# Patient Record
Sex: Male | Born: 1965 | Race: Black or African American | Hispanic: No | Marital: Single | Smoking: Current every day smoker
Health system: Southern US, Community
[De-identification: ages and names within clinical notes are randomized; demographics above are authoritative.]

## PROBLEM LIST (undated history)

## (undated) DIAGNOSIS — D170 Benign lipomatous neoplasm of skin and subcutaneous tissue of head, face and neck: Secondary | ICD-10-CM

## (undated) DIAGNOSIS — R21 Rash and other nonspecific skin eruption: Secondary | ICD-10-CM

## (undated) DIAGNOSIS — L989 Disorder of the skin and subcutaneous tissue, unspecified: Secondary | ICD-10-CM

## (undated) DIAGNOSIS — I10 Essential (primary) hypertension: Secondary | ICD-10-CM

## (undated) HISTORY — DX: Essential (primary) hypertension: I10

## (undated) HISTORY — PX: NO PAST SURGERIES: SHX2092

---

## 1998-03-30 ENCOUNTER — Encounter: Admission: RE | Admit: 1998-03-30 | Discharge: 1998-03-30 | Payer: Self-pay | Admitting: Family Medicine

## 2002-03-09 ENCOUNTER — Emergency Department (HOSPITAL_COMMUNITY): Admission: EM | Admit: 2002-03-09 | Discharge: 2002-03-09 | Payer: Self-pay | Admitting: Emergency Medicine

## 2013-10-21 ENCOUNTER — Emergency Department (HOSPITAL_COMMUNITY): Payer: Self-pay

## 2013-10-21 ENCOUNTER — Emergency Department (HOSPITAL_COMMUNITY)
Admission: EM | Admit: 2013-10-21 | Discharge: 2013-10-21 | Disposition: A | Discharge status: Home / Self Care | Payer: Self-pay | Attending: Emergency Medicine | Admitting: Emergency Medicine

## 2013-10-21 ENCOUNTER — Encounter (HOSPITAL_COMMUNITY): Payer: Self-pay | Admitting: Emergency Medicine

## 2013-10-21 DIAGNOSIS — K5289 Other specified noninfective gastroenteritis and colitis: Secondary | ICD-10-CM | POA: Insufficient documentation

## 2013-10-21 DIAGNOSIS — R05 Cough: Secondary | ICD-10-CM | POA: Insufficient documentation

## 2013-10-21 DIAGNOSIS — R1013 Epigastric pain: Secondary | ICD-10-CM

## 2013-10-21 DIAGNOSIS — F17200 Nicotine dependence, unspecified, uncomplicated: Secondary | ICD-10-CM | POA: Insufficient documentation

## 2013-10-21 DIAGNOSIS — R059 Cough, unspecified: Secondary | ICD-10-CM | POA: Insufficient documentation

## 2013-10-21 DIAGNOSIS — K529 Noninfective gastroenteritis and colitis, unspecified: Secondary | ICD-10-CM

## 2013-10-21 LAB — COMPREHENSIVE METABOLIC PANEL
ALK PHOS: 54 U/L (ref 39–117)
ALT: 9 U/L (ref 0–53)
AST: 26 U/L (ref 0–37)
Albumin: 3.6 g/dL (ref 3.5–5.2)
BUN: 15 mg/dL (ref 6–23)
CO2: 25 mEq/L (ref 19–32)
CREATININE: 1.15 mg/dL (ref 0.50–1.35)
Calcium: 9.1 mg/dL (ref 8.4–10.5)
Chloride: 97 mEq/L (ref 96–112)
GFR calc non Af Amer: 74 mL/min — ABNORMAL LOW (ref >90)
GFR, EST AFRICAN AMERICAN: 85 mL/min — AB (ref >90)
GLUCOSE: 108 mg/dL — AB (ref 70–99)
POTASSIUM: 3.9 meq/L (ref 3.7–5.3)
Sodium: 137 mEq/L (ref 137–147)
TOTAL PROTEIN: 6.9 g/dL (ref 6.0–8.3)
Total Bilirubin: 0.2 mg/dL — ABNORMAL LOW (ref 0.3–1.2)

## 2013-10-21 LAB — URINE MICROSCOPIC-ADD ON

## 2013-10-21 LAB — CBC WITH DIFFERENTIAL/PLATELET
BASOS ABS: 0 10*3/uL (ref 0.0–0.1)
Basophils Relative: 0 % (ref 0–1)
EOS PCT: 0 % (ref 0–5)
Eosinophils Absolute: 0 10*3/uL (ref 0.0–0.7)
HCT: 38.2 % — ABNORMAL LOW (ref 39.0–52.0)
Hemoglobin: 13.5 g/dL (ref 13.0–17.0)
LYMPHS PCT: 14 % (ref 12–46)
Lymphs Abs: 1.2 10*3/uL (ref 0.7–4.0)
MCH: 31.8 pg (ref 26.0–34.0)
MCHC: 35.3 g/dL (ref 30.0–36.0)
MCV: 90.1 fL (ref 78.0–100.0)
MONOS PCT: 11 % (ref 3–12)
Monocytes Absolute: 0.9 10*3/uL (ref 0.1–1.0)
Neutro Abs: 6.5 10*3/uL (ref 1.7–7.7)
Neutrophils Relative %: 75 % (ref 43–77)
PLATELETS: 220 10*3/uL (ref 150–400)
RBC: 4.24 MIL/uL (ref 4.22–5.81)
RDW: 13.4 % (ref 11.5–15.5)
WBC MORPHOLOGY: INCREASED
WBC: 8.6 10*3/uL (ref 4.0–10.5)

## 2013-10-21 LAB — URINALYSIS, ROUTINE W REFLEX MICROSCOPIC
Bilirubin Urine: NEGATIVE
Glucose, UA: NEGATIVE mg/dL
Hgb urine dipstick: NEGATIVE
Ketones, ur: NEGATIVE mg/dL
LEUKOCYTES UA: NEGATIVE
Nitrite: NEGATIVE
PROTEIN: 30 mg/dL — AB
Specific Gravity, Urine: 1.031 — ABNORMAL HIGH (ref 1.005–1.030)
UROBILINOGEN UA: 0.2 mg/dL (ref 0.0–1.0)
pH: 5.5 (ref 5.0–8.0)

## 2013-10-21 LAB — LIPASE, BLOOD: Lipase: 23 U/L (ref 11–59)

## 2013-10-21 MED ORDER — FENTANYL CITRATE 0.05 MG/ML IJ SOLN
50.0000 ug | Freq: Once | INTRAMUSCULAR | Status: AC
  Administered 2013-10-21: 50 ug via INTRAVENOUS
  Filled 2013-10-21: qty 2

## 2013-10-21 MED ORDER — IOHEXOL 300 MG/ML  SOLN
50.0000 mL | Freq: Once | INTRAMUSCULAR | Status: AC | PRN
  Administered 2013-10-21: 50 mL via ORAL

## 2013-10-21 MED ORDER — IOHEXOL 300 MG/ML  SOLN
100.0000 mL | Freq: Once | INTRAMUSCULAR | Status: AC | PRN
  Administered 2013-10-21: 100 mL via INTRAVENOUS

## 2013-10-21 MED ORDER — ACETAMINOPHEN 325 MG PO TABS
650.0000 mg | ORAL_TABLET | Freq: Once | ORAL | Status: AC
  Administered 2013-10-21: 650 mg via ORAL
  Filled 2013-10-21: qty 2

## 2013-10-21 MED ORDER — SODIUM CHLORIDE 0.9 % IV BOLUS (SEPSIS)
1000.0000 mL | Freq: Once | INTRAVENOUS | Status: AC
  Administered 2013-10-21: 1000 mL via INTRAVENOUS

## 2013-10-21 MED ORDER — ONDANSETRON HCL 4 MG/2ML IJ SOLN
4.0000 mg | Freq: Once | INTRAMUSCULAR | Status: AC
  Administered 2013-10-21: 4 mg via INTRAVENOUS
  Filled 2013-10-21: qty 2

## 2013-10-21 NOTE — ED Notes (Signed)
Pt to CT

## 2013-10-21 NOTE — ED Notes (Signed)
Pt reports centralized lower abdominal pain that began on Monday. Pt reports having one episode of emesis, which was today. Pt reports having a productive cough with clear sputum. Pt also states the abdominal pain is worse when he coughs. Pt reports having loose stools since yesterday. Pt has an oral temp on arrival of 100.6. Pt is A/O x4 and in NAD.

## 2013-10-21 NOTE — ED Notes (Signed)
Initial Contact - pt to [facility] with c/o 7/10 lower abd pain, also reports subjective fevers and cough.  Skin PWD, ambulatory with steady gait, speaking full/clear sentences, NAD.

## 2013-10-21 NOTE — ED Notes (Signed)
Pt aware of need for urine spec, sts unable to provide at this time.

## 2013-10-21 NOTE — ED Provider Notes (Signed)
CSN: [id number]     Arrival date & time 10/21/13  1829 History   First MD Initiated Contact with Patient 10/21/13 1902     Chief Complaint  Patient presents with  . Abdominal Pain  . Emesis     (Consider location/radiation/quality/duration/timing/severity/associated sxs/prior Treatment) HPI Comments: 48 yo old male with no medical hx, smoker, no surgeries presents with recurrent upper abdominal pain and productive cough, constant ache and fevers low grade for 5 days.  Mild vomiting.  No diarrhea but loose stools.  No GI hx.  Nothing improves.  Non radiating.  Patient is a 48 y.o. male presenting with abdominal pain and vomiting. The history is provided by the patient.  Abdominal Pain Associated symptoms: cough, nausea and vomiting   Associated symptoms: no chest pain, no chills, no diarrhea, no dysuria, no fever and no shortness of breath   Emesis Associated symptoms: abdominal pain   Associated symptoms: no chills, no diarrhea and no headaches     History reviewed. No pertinent past medical history. History reviewed. No pertinent past surgical history. No family history on file. History  Substance Use Topics  . Smoking status: Current Every Day Smoker -- 1.00 packs/day for 20 years    Types: Cigarettes  . Smokeless tobacco: Never Used  . Alcohol Use: Yes     Comment: Socially    Review of Systems  Constitutional: Negative for fever and chills.  HENT: Negative for congestion.   Eyes: Negative for visual disturbance.  Respiratory: Positive for cough. Negative for shortness of breath.   Cardiovascular: Negative for chest pain.  Gastrointestinal: Positive for nausea, vomiting and abdominal pain. Negative for diarrhea and blood in stool.  Genitourinary: Negative for dysuria and flank pain.  Musculoskeletal: Negative for back pain, neck pain and neck stiffness.  Skin: Negative for rash.  Neurological: Negative for light-headedness and headaches.      Allergies  Review of  patient's allergies indicates no known allergies.  Home Medications   Current Outpatient Rx  Name  Route  Sig  Dispense  Refill  . aspirin-sod bicarb-citric acid (ALKA-SELTZER) 325 MG TBEF tablet   Oral   Take 325 mg by mouth every 6 (six) hours as needed (flu-like symptoms).         Marland Kitchen DM-Doxylamine-Acetaminophen (NYQUIL COLD & FLU PO)   Oral   Take 1 capsule by mouth at bedtime as needed (FLU-LIKE SYMPTOMS).         Marland Kitchen ibuprofen (ADVIL,MOTRIN) 200 MG tablet   Oral   Take 400 mg by mouth every 6 (six) hours as needed (pain).          BP 141/85  Pulse 62  Temp(Src) 100.6 F (38.1 C) (Oral)  Resp 20  SpO2 96% Physical Exam  Nursing note and vitals reviewed. Constitutional: He is oriented to person, place, and time. He appears well-developed and well-nourished.  HENT:  Head: Normocephalic and atraumatic.  Mild dry mm  Eyes: Conjunctivae are normal. Right eye exhibits no discharge. Left eye exhibits no discharge.  Neck: Normal range of motion. Neck supple. No tracheal deviation present.  Cardiovascular: Normal rate and regular rhythm.   Pulmonary/Chest: Effort normal and breath sounds normal.  Abdominal: Soft. He exhibits no distension. There is tenderness (upper bilateral and epig, worse LUQ). There is no guarding.  Musculoskeletal: He exhibits no edema.  Neurological: He is alert and oriented to person, place, and time.  Skin: Skin is warm. No rash noted.  Psychiatric: He has a normal mood and  affect.    ED Course  Procedures (including critical care time) Labs Review Labs Reviewed  COMPREHENSIVE METABOLIC PANEL - Abnormal; Notable for the following:    Glucose, Bld 108 (*)    Total Bilirubin 0.2 (*)    GFR calc non Af Amer 74 (*)    GFR calc Af Amer 85 (*)    All other components within normal limits  CBC WITH DIFFERENTIAL - Abnormal; Notable for the following:    HCT 38.2 (*)    All other components within normal limits  URINALYSIS, ROUTINE W REFLEX  MICROSCOPIC - Abnormal; Notable for the following:    Specific Gravity, Urine 1.031 (*)    Protein, ur 30 (*)    All other components within normal limits  LIPASE, BLOOD  URINE MICROSCOPIC-ADD ON   Imaging Review Dg Chest 2 View  10/21/2013   CLINICAL DATA:  Abdominal pain and emesis  EXAM: CHEST  2 VIEW  COMPARISON:  None.  FINDINGS: Normal heart size and mediastinal contours. Mild pulmonary hyperinflation. No acute infiltrate or edema. No effusion or pneumothorax. No acute osseous findings.  IMPRESSION: No active cardiopulmonary disease.   Electronically Signed   By: Jorje Guild M.D.   On: 10/21/2013 20:52   Ct Abdomen Pelvis W Contrast  10/21/2013   CLINICAL DATA:  Centralized lower abdominal pain. Episode of emesis.  EXAM: CT ABDOMEN AND PELVIS WITH CONTRAST  TECHNIQUE: Multidetector CT imaging of the abdomen and pelvis was performed using the standard protocol following bolus administration of intravenous contrast.  CONTRAST:  44mL OMNIPAQUE IOHEXOL 300 MG/ML SOLN, 169mL OMNIPAQUE IOHEXOL 300 MG/ML SOLN  COMPARISON:  None.  FINDINGS: Lung bases are clear.  No evidence for free air.  Normal appearance of the liver, gallbladder and portal venous system. Normal appearance of the spleen and pancreas and adrenal glands. There is a 1 cm low-density structure in the left kidney upper pole which has fluid density on the coronal reformats and most compatible with a cyst. Normal appearance of the right kidney. No significant lymphadenopathy.  There is a small amount of free fluid in the pelvis and there is fluid along the right abdomen on sequence 2, image 47. The patient appears to have a small right inguinal hernia containing fluid. The fluid in this small inguinal hernia is slightly dense. The appendix extends into the small right inguinal hernia region but the appendix has a normal appearance and contains oral contrast. There does not appear to be appendix inflammation. No gross abnormality to the  small bowel loops. Incidentally, the patient has a circumaortic left renal vein. No acute bone abnormality.  IMPRESSION: There is a small amount of free fluid in the pelvis and right lower abdomen. The patient has a small right inguinal hernia which contains fluid and the appendix. However, the appendix is well opacified with contrast and there is no evidence for wall thickening or inflammation. The etiology of the free fluid is unknown but could be related to enteritis. Acute appendicitis is thought to be unlikely.  These results were called by telephone at the time of interpretation on 10/21/2013 at 10:46 PM to Dr. Elnora Morrison , who verbally acknowledged these results.   Electronically Signed   By: Markus Daft M.D.   On: 10/21/2013 22:47     EKG Interpretation None      MDM   Final diagnoses:  Epigastric pain  Enteritis   Clinically gastritis vs colitis.  Other differential GB, lower pneumonia, other. Fluids, pain meds,  labs, CT abd and CXR. Non toxic appearing.  CT no acute findings, mild RLQ free fluid but normal appendix. Discussed with radiology and surgery on call.  General surgery agrees that this is not appendicitis, fup outpt.   Pt improved on recheck, no RLQ pain. Results and differential diagnosis were discussed with the patient. Close follow up outpatient was discussed, patient comfortable with the plan.   Filed Vitals:   10/21/13 1850 10/21/13 2225  BP: 141/85 137/83  Pulse: 62 46  Temp: 100.6 F (38.1 C)   TempSrc: Oral   Resp: 20 18  SpO2: 96% 96%         Mariea Clonts, MD 10/21/13 2318

## 2013-10-21 NOTE — ED Notes (Signed)
Pt to radiology.

## 2013-10-21 NOTE — Discharge Instructions (Signed)
If you were given medicines take as directed.  If you are on coumadin or contraceptives realize their levels and effectiveness is altered by many different medicines.  If you have any reaction (rash, tongues swelling, other) to the medicines stop taking and see a physician.   Please follow up as directed and return to the ER or see a physician for new or worsening symptoms.  Thank you. Filed Vitals:   10/21/13 1850 10/21/13 2225  BP: 141/85 137/83  Pulse: 62 46  Temp: 100.6 F (38.1 C)   TempSrc: Oral   Resp: 20 18  SpO2: 96% 96%   If your abdominal pain worsens, you develop fevers, persistent vomiting or if your pain moves to the right lower quadrant return immediately to see your physician or come to the Emergency Department.  Thank you

## 2013-10-23 ENCOUNTER — Emergency Department (HOSPITAL_COMMUNITY): Payer: Self-pay

## 2013-10-23 ENCOUNTER — Encounter (HOSPITAL_COMMUNITY): Payer: Self-pay | Admitting: Emergency Medicine

## 2013-10-23 ENCOUNTER — Emergency Department (HOSPITAL_COMMUNITY)
Admission: EM | Admit: 2013-10-23 | Discharge: 2013-10-23 | Disposition: A | Discharge status: Home / Self Care | Payer: Self-pay | Attending: Emergency Medicine | Admitting: Emergency Medicine

## 2013-10-23 DIAGNOSIS — R05 Cough: Secondary | ICD-10-CM | POA: Insufficient documentation

## 2013-10-23 DIAGNOSIS — R079 Chest pain, unspecified: Secondary | ICD-10-CM

## 2013-10-23 DIAGNOSIS — F1210 Cannabis abuse, uncomplicated: Secondary | ICD-10-CM | POA: Insufficient documentation

## 2013-10-23 DIAGNOSIS — R0789 Other chest pain: Secondary | ICD-10-CM | POA: Insufficient documentation

## 2013-10-23 DIAGNOSIS — F1290 Cannabis use, unspecified, uncomplicated: Secondary | ICD-10-CM

## 2013-10-23 DIAGNOSIS — F17200 Nicotine dependence, unspecified, uncomplicated: Secondary | ICD-10-CM

## 2013-10-23 DIAGNOSIS — R110 Nausea: Secondary | ICD-10-CM

## 2013-10-23 DIAGNOSIS — R197 Diarrhea, unspecified: Secondary | ICD-10-CM

## 2013-10-23 DIAGNOSIS — R059 Cough, unspecified: Secondary | ICD-10-CM

## 2013-10-23 MED ORDER — ONDANSETRON 4 MG PO TBDP
4.0000 mg | ORAL_TABLET | Freq: Once | ORAL | Status: AC
  Administered 2013-10-23: 4 mg via ORAL
  Filled 2013-10-23: qty 1

## 2013-10-23 MED ORDER — PROMETHAZINE HCL 25 MG PO TABS: 25.0000 mg | ORAL_TABLET | Freq: Four times a day (QID) | ORAL | Status: DC | PRN

## 2013-10-23 MED ORDER — GI COCKTAIL ~~LOC~~
30.0000 mL | Freq: Once | ORAL | Status: AC
  Administered 2013-10-23: 30 mL via ORAL
  Filled 2013-10-23: qty 30

## 2013-10-23 MED ORDER — IPRATROPIUM-ALBUTEROL 0.5-2.5 (3) MG/3ML IN SOLN
3.0000 mL | Freq: Once | RESPIRATORY_TRACT | Status: AC
  Administered 2013-10-23: 3 mL via RESPIRATORY_TRACT
  Filled 2013-10-23: qty 3

## 2013-10-23 NOTE — ED Provider Notes (Signed)
CSN: [id number]     Arrival date & time 10/23/13  1208 History   First MD Initiated Contact with Patient 10/23/13 1226     Chief Complaint  Patient presents with  . Chest Pain  . Cough     (Consider location/radiation/quality/duration/timing/severity/associated sxs/prior Treatment) HPI Pt is a 48yo male c/o chest pain and cough. Pt states he was seen on 3/19 for GI and respiratory symptoms. States his vomiting has stopped but he still feels nauseous and had 2-3 episodes of watery diarrhea earlier today. Denies blood or mucous in stool. Pt states chest pain is in center of chest, intermittent, sharp in nature, 6/10. Pt reports taking tylenol at home w/o relief. Pt states he also feels generalized weakness but states he has not been eating or drinking much due to nausea. Denies urinary symptoms. Reports mild abdominal cramping. Reports subjective fever. No hx of abdominal surgeries. No sick contacts or recent travel. No hx of CAD. No hx of blood clots.  Pt does admit to smoking 1ppd tobacco and marijuana about once a day. Denies hx of IV drug use.   History reviewed. No pertinent past medical history. No past surgical history on file. No family history on file. History  Substance Use Topics  . Smoking status: Current Every Day Smoker -- 1.00 packs/day for 20 years    Types: Cigarettes  . Smokeless tobacco: Never Used  . Alcohol Use: Yes     Comment: Socially    Review of Systems  Constitutional: Positive for fever ( subjective). Negative for chills.  HENT: Positive for congestion.   Respiratory: Positive for cough and shortness of breath. Negative for wheezing and stridor.   Cardiovascular: Positive for chest pain. Negative for palpitations and leg swelling.  Gastrointestinal: Positive for nausea and diarrhea. Negative for vomiting, abdominal pain, constipation and blood in stool.  Genitourinary: Negative for dysuria, frequency, hematuria and flank pain.  All other systems reviewed and  are negative.      Allergies  Review of patient's allergies indicates no known allergies.  Home Medications   Current Outpatient Rx  Name  Route  Sig  Dispense  Refill  . promethazine (PHENERGAN) 25 MG tablet   Oral   Take 1 tablet (25 mg total) by mouth every 6 (six) hours as needed for nausea or vomiting.   12 tablet   0    BP 145/82  Pulse 94  Temp(Src) 98.7 F (37.1 C) (Oral)  Resp 16  SpO2 95% Physical Exam  Nursing note and vitals reviewed. Constitutional: He appears well-developed and well-nourished.  Pt lying comfortably in exam bed, NAD.   HENT:  Head: Normocephalic and atraumatic.  Eyes: Conjunctivae are normal. No scleral icterus.  Neck: Normal range of motion. Neck supple.  Cardiovascular: Normal rate, regular rhythm and normal heart sounds.   Pulmonary/Chest: Effort normal. No respiratory distress. He has decreased breath sounds in the right lower field and the left lower field. He has no wheezes. He has no rales. He exhibits no tenderness.  bibasilar decreased breath sounds. No respiratory distress. Able to speak in full sentences w/o difficulty.  Abdominal: Soft. Bowel sounds are normal. He exhibits no distension and no mass. There is tenderness. There is no rebound and no guarding.  Musculoskeletal: Normal range of motion.  Neurological: He is alert.  Skin: Skin is warm and dry.    ED Course  Procedures (including critical care time) Labs Review Labs Reviewed - No data to display Imaging Review Dg Chest 2  View  10/21/2013   CLINICAL DATA:  Abdominal pain and emesis  EXAM: CHEST  2 VIEW  COMPARISON:  None.  FINDINGS: Normal heart size and mediastinal contours. Mild pulmonary hyperinflation. No acute infiltrate or edema. No effusion or pneumothorax. No acute osseous findings.  IMPRESSION: No active cardiopulmonary disease.   Electronically Signed   By: Jorje Guild M.D.   On: 10/21/2013 20:52   Ct Abdomen Pelvis W Contrast  10/21/2013   CLINICAL  DATA:  Centralized lower abdominal pain. Episode of emesis.  EXAM: CT ABDOMEN AND PELVIS WITH CONTRAST  TECHNIQUE: Multidetector CT imaging of the abdomen and pelvis was performed using the standard protocol following bolus administration of intravenous contrast.  CONTRAST:  39mL OMNIPAQUE IOHEXOL 300 MG/ML SOLN, 112mL OMNIPAQUE IOHEXOL 300 MG/ML SOLN  COMPARISON:  None.  FINDINGS: Lung bases are clear.  No evidence for free air.  Normal appearance of the liver, gallbladder and portal venous system. Normal appearance of the spleen and pancreas and adrenal glands. There is a 1 cm low-density structure in the left kidney upper pole which has fluid density on the coronal reformats and most compatible with a cyst. Normal appearance of the right kidney. No significant lymphadenopathy.  There is a small amount of free fluid in the pelvis and there is fluid along the right abdomen on sequence 2, image 47. The patient appears to have a small right inguinal hernia containing fluid. The fluid in this small inguinal hernia is slightly dense. The appendix extends into the small right inguinal hernia region but the appendix has a normal appearance and contains oral contrast. There does not appear to be appendix inflammation. No gross abnormality to the small bowel loops. Incidentally, the patient has a circumaortic left renal vein. No acute bone abnormality.  IMPRESSION: There is a small amount of free fluid in the pelvis and right lower abdomen. The patient has a small right inguinal hernia which contains fluid and the appendix. However, the appendix is well opacified with contrast and there is no evidence for wall thickening or inflammation. The etiology of the free fluid is unknown but could be related to enteritis. Acute appendicitis is thought to be unlikely.  These results were called by telephone at the time of interpretation on 10/21/2013 at 10:46 PM to Dr. Elnora Morrison , who verbally acknowledged these results.    Electronically Signed   By: Markus Daft M.D.   On: 10/21/2013 22:47    CXR-    EKG Interpretation   Date/Time:  Saturday October 23 2013 12:13:21 EDT Ventricular Rate:  68 PR Interval:  123 QRS Duration: 82 QT Interval:  410 QTC Calculation: 436 R Axis:   79 Text Interpretation:  Sinus rhythm Biatrial enlargement ST elev, probable  normal early repol pattern No previous tracing Confirmed by Maryan Rued  MD,  Loree Fee ([id number]) on 10/23/2013 12:24:25 PM      MDM   Final diagnoses:  Cough  Chest pain  Nausea  Diarrhea  Current smoker  Marijuana smoker    Pt recently in ED for n/v/d and cough presenting to ED with nausea, diarrhea, cough and chest pain. Pt states he was not discharged on 3/19 with nausea medication and states chest pain is new. Pt is a daily smoker. No hx of asthma.  PERC negative. Not concerned for ACS.  Pt denies IVDU. EKG: unremarkable, doubt pericarditis.   CXR: normal.  Will tx symptomatically.  Tx in ED: GI cocktail, duoneb, and zofran. Pt states he did have  improvement with medications.  Rx: phenergan. Had 5 minute discussion with pt about importance of smoking cessation. Info packet provided. Advised to f/u with PCP. Return precautions provided. Pt verbalized understanding and agreement with tx plan.     Noland Fordyce, PA-C 10/23/13 1610

## 2013-10-23 NOTE — ED Notes (Signed)
He states he was recently seen here for GI and resp. Complaints.  Here today with c/opersistent cough and discomfort "around my heart".   EKG performed at triage.  He is in no distress and his skin is normal, warm and dry and he is breathing normally.

## 2013-10-24 NOTE — ED Provider Notes (Signed)
Medical screening examination/treatment/procedure(s) were performed by non-physician practitioner and as supervising physician I was immediately available for consultation/collaboration.   EKG Interpretation   Date/Time:  Saturday October 23 2013 12:13:21 EDT Ventricular Rate:  68 PR Interval:  123 QRS Duration: 82 QT Interval:  410 QTC Calculation: 436 R Axis:   79 Text Interpretation:  Sinus rhythm Biatrial enlargement ST elev, probable  normal early repol pattern No previous tracing Confirmed by Maryan Rued  MD,  Welton Bord ([id number]) on 10/23/2013 12:24:25 PM        Blanchie Dessert, MD 10/24/13 2132

## 2013-11-08 ENCOUNTER — Ambulatory Visit: Payer: Self-pay

## 2014-11-09 IMAGING — CR DG CHEST 2V
2 series · 2 of 2 positions shown · non-contrast
Comparison: 10/21/2013

CLINICAL DATA: Cough, congestion, fever

EXAM:
CHEST  2 VIEW

[w chest pa]
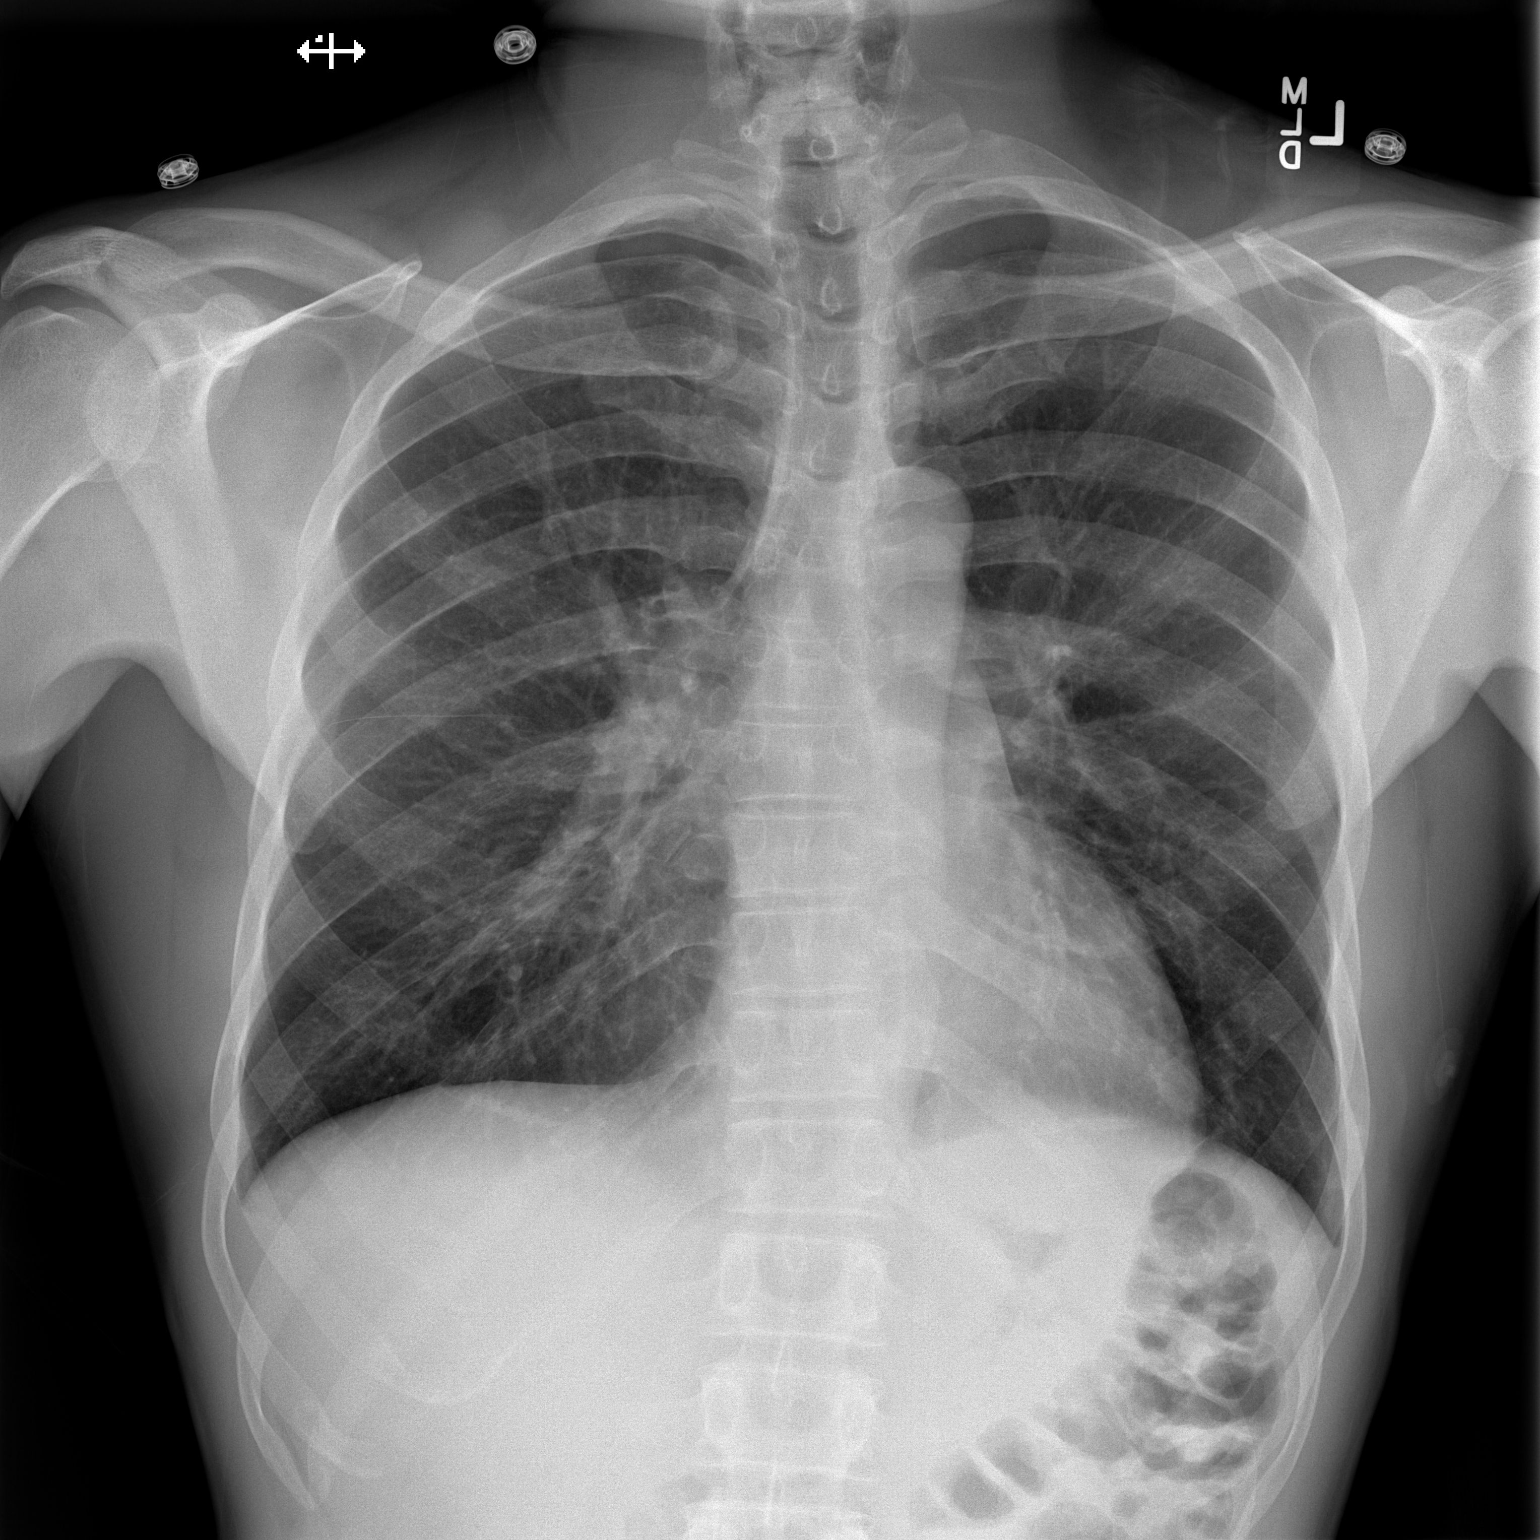

[w chest lat]
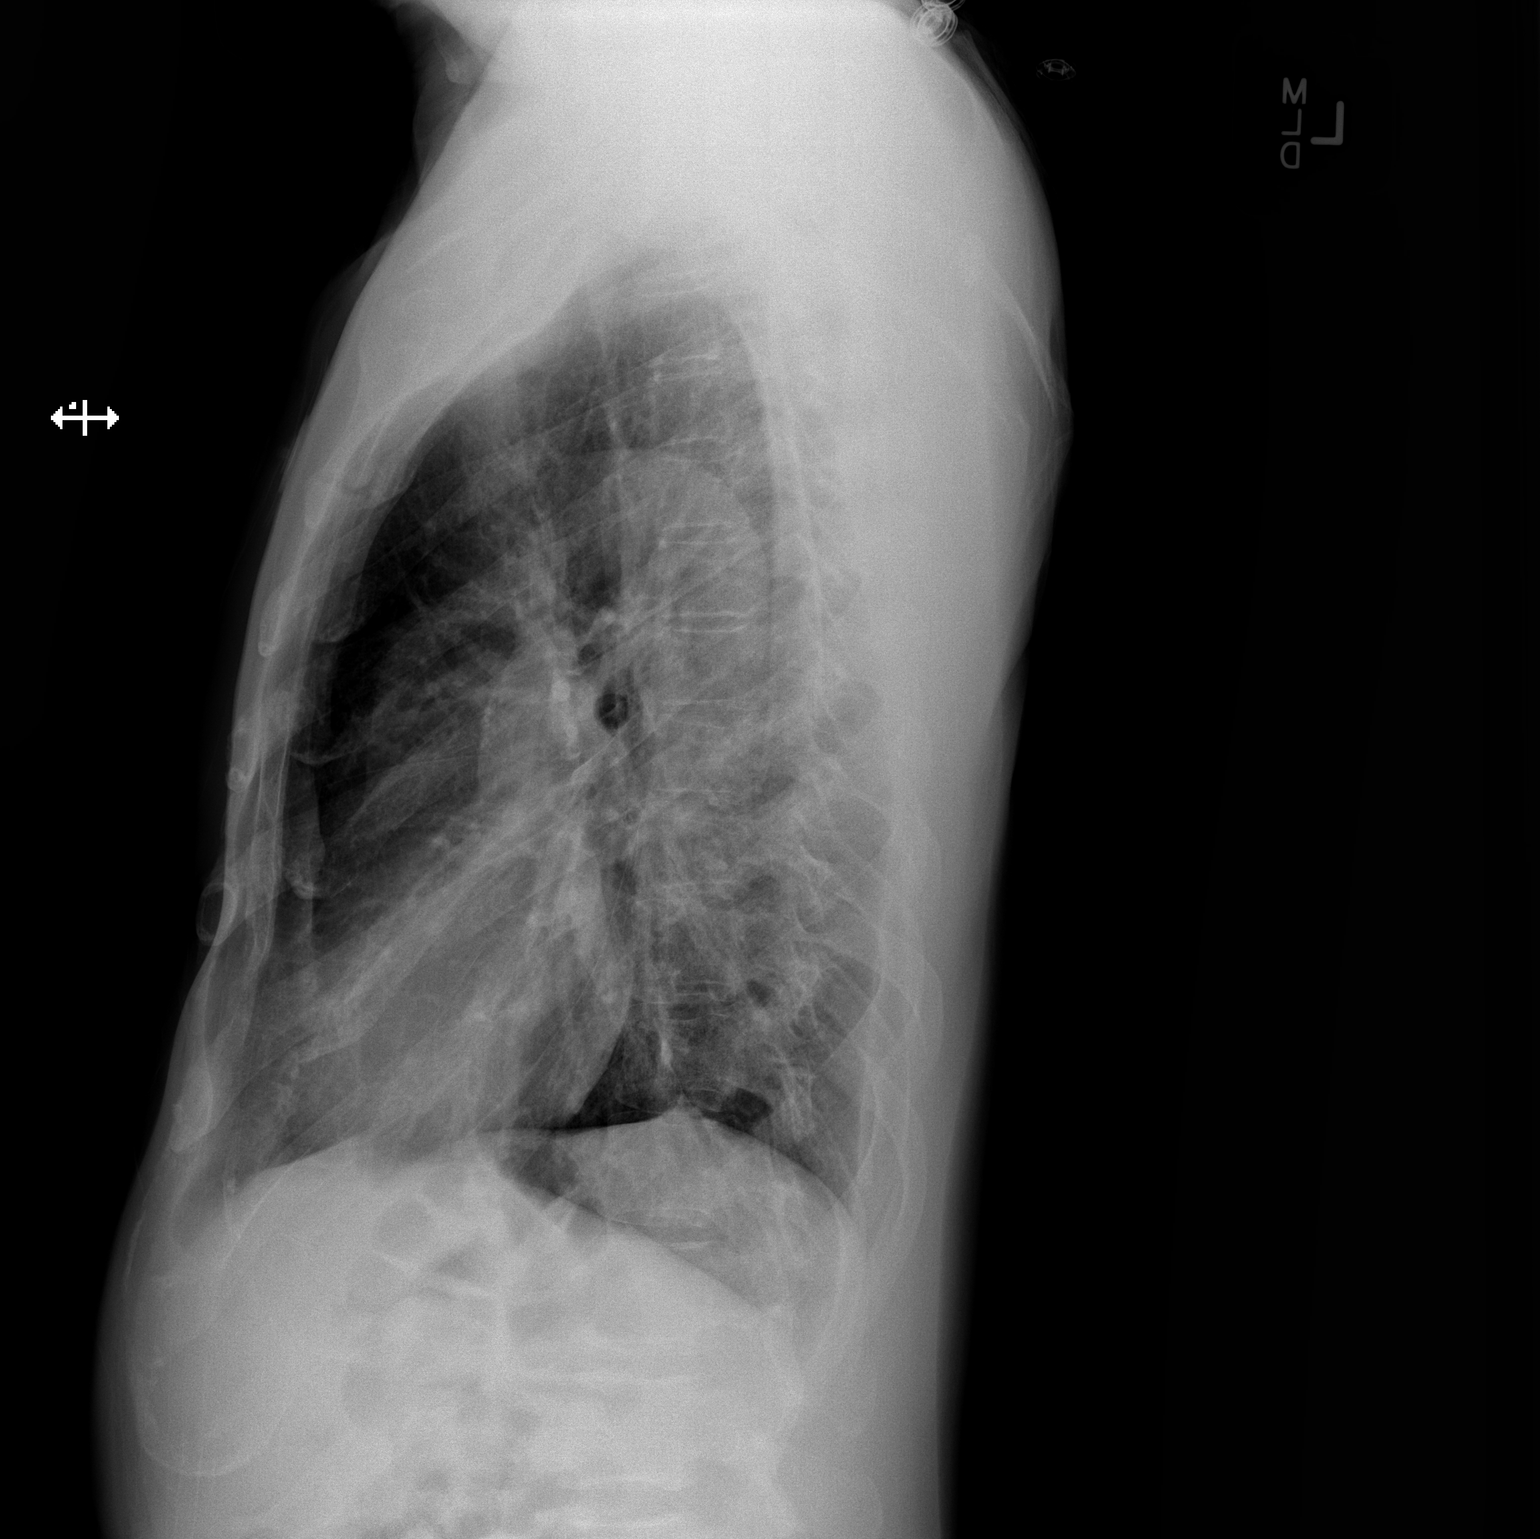

[2 of 2 positions shown; findings below may reference images not displayed]

FINDINGS: Lungs are clear. No pleural effusion or pneumothorax.

The heart is normal in size.

Visualized osseous structures are within normal limits.
IMPRESSION: No evidence of acute cardiopulmonary disease.

## 2015-09-19 ENCOUNTER — Ambulatory Visit: Payer: BLUE CROSS/BLUE SHIELD | Admitting: Internal Medicine

## 2015-09-27 ENCOUNTER — Encounter: Payer: Self-pay | Admitting: Internal Medicine

## 2015-09-27 ENCOUNTER — Ambulatory Visit (INDEPENDENT_AMBULATORY_CARE_PROVIDER_SITE_OTHER): Payer: BLUE CROSS/BLUE SHIELD | Admitting: Internal Medicine

## 2015-09-27 VITALS — BP 140/82 | HR 51 | Temp 98.1°F | Ht 69.0 in | Wt 155.9 lb

## 2015-09-27 DIAGNOSIS — D369 Benign neoplasm, unspecified site: Secondary | ICD-10-CM

## 2015-09-27 DIAGNOSIS — R7309 Other abnormal glucose: Secondary | ICD-10-CM

## 2015-09-27 DIAGNOSIS — Z113 Encounter for screening for infections with a predominantly sexual mode of transmission: Secondary | ICD-10-CM | POA: Diagnosis not present

## 2015-09-27 DIAGNOSIS — Z720 Tobacco use: Secondary | ICD-10-CM

## 2015-09-27 DIAGNOSIS — IMO0001 Reserved for inherently not codable concepts without codable children: Secondary | ICD-10-CM | POA: Insufficient documentation

## 2015-09-27 DIAGNOSIS — R030 Elevated blood-pressure reading, without diagnosis of hypertension: Secondary | ICD-10-CM

## 2015-09-27 LAB — POCT GLYCOSYLATED HEMOGLOBIN (HGB A1C): Hemoglobin A1C: 5

## 2015-09-27 NOTE — Patient Instructions (Signed)
Thank you for coming to see me today. It was a pleasure. Today we talked about:   Quitting Smoking: Please try a 14 mg nicotine patch that you can buy over the counter at any drug store. Also, make an appointment with Dr. Valentina Lucks (Pharmacy) for smoking counseling.   Elevated Blood Pressure: Your blood pressure was initially high but was normal on repeat.   Cyst on Chin: A referral for surgery has been placed. Please ask them about the mole on your nose when you see them as well.   I will call you about any lab results that we need to discuss further.   Please follow-up with Dr. Juleen China in 3-6 months or sooner as needed.   If you have any questions or concerns, please do not hesitate to call the office at [phone].  Take Care,   Phill Myron, DO

## 2015-09-27 NOTE — Assessment & Plan Note (Signed)
No HIV results in EMR.  -will obtain HIV today due to USPSTFs recommendation for one time screening for patients >18

## 2015-09-27 NOTE — Assessment & Plan Note (Signed)
Patient with long standing history of tobacco abuse. Expresses interest in quitting smoking today but reports past failed attempts.  -recommended trial of nicotine 14 mg patch -patient to make an appointment with Dr. Valentina Lucks (pharmacy) for smoking cessation counseling  -would consider intervention such as Chantix if patient unable to quit using nicotine patch

## 2015-09-27 NOTE — Progress Notes (Signed)
Subjective:    Bradley Haas - 50 y.o. male MRN [id number]  Date of birth: [birth year]  HPI  Bradley Samples Keena is a new patient with PMH of tobacco abuse here to establish care.  Elevated BP: Initial BP elevated to 161/86. Patient denies any history of elevated BPs. Denies chest pain, headaches, or changes in vision.   Knot Under Chin: Patient complains of mass under chin present for 2-3 years. Has been increasing in size over time. Reports that it is irritating, usually itchy but sometimes aching as well. Denies any drainage or bleeding from the area. No past medical history of cancer.   Tobacco Abuse: Began smoking at age of 15 in 27. Currently smoking about 1 ppd. Reports that he is very interested in quitting smoking. Has tried quitting cold [place] in the past as well as tried decreasing number of cigarettes per day over time but reports "neither seemed to work or stick". Has not tried nicotine patches or gum.    Health Maintenance Due  Topic Date Due  . HIV Screening  10/04/1980  . TETANUS/TDAP  10/04/1984  . INFLUENZA VACCINE  03/06/2015    -  reports that he has been smoking Cigarettes.  He has a 20 pack-year smoking history. He has never used smokeless tobacco. - Review of Systems: Per HPI. - Past Medical History: Patient Active Problem List   Diagnosis Date Noted  . Dermoid cyst 09/27/2015  . Elevated blood pressure 09/27/2015  . Screening for STD (sexually transmitted disease) 09/27/2015  . Tobacco abuse 09/27/2015   - Medications: reviewed and updated Current Outpatient Prescriptions  Medication Sig Dispense Refill  . promethazine (PHENERGAN) 25 MG tablet Take 1 tablet (25 mg total) by mouth every 6 (six) hours as needed for nausea or vomiting. 12 tablet 0   No current facility-administered medications for this visit.      Objective:   Physical Exam BP 140/82 mmHg  Pulse 51  Temp(Src) 98.1 F (36.7 C) (Oral)  Ht 5\' 9"  (1.753 m)  Wt 155 lb 14.4 oz (70.716 kg)   BMI 23.01 kg/m2 Gen: NAD, alert, cooperative with exam, well-appearing HEENT: NCAT, PERRL, clear conjunctiva, oropharynx clear, supple neck CV: RRR, good S1/S2, no murmur, no edema, capillary refill brisk  Resp: CTABL, no wheezes, non-labored Abd: SNTND, BS present, no guarding or organomegaly Skin: mobile, soft 4 cm x 4.5 cm round mass present under left mandible  Neuro: no gross deficits.  Psych: good insight, alert and oriented          Assessment & Plan:   Dermoid cyst Given that mass under chin is soft, mobile and has been present for several years increasing in time appears consistent with a dermoid cyst.  -given size of cyst, surgery referral placed for further evaluation and removal   Elevated blood pressure Blood pressure initially elevated to 161/86 but normalized on repeat check to 140/82.  -BMET ordered to evaluate kidney function -given longstanding history of tobacco use and initially elevated BP, will check lipid panel today to calculate ACSVD risk  -given past history of elevated glucose on prior BMETs with elevated BP as well as family history of T2DM, will obtain A1C today   Screening for STD (sexually transmitted disease) No HIV results in EMR.  -will obtain HIV today due to USPSTFs recommendation for one time screening for patients >18   Tobacco abuse Patient with long standing history of tobacco abuse. Expresses interest in quitting smoking today but reports past failed  attempts.  -recommended trial of nicotine 14 mg patch -patient to make an appointment with Dr. Valentina Lucks (pharmacy) for smoking cessation counseling  -would consider intervention such as Chantix if patient unable to quit using nicotine patch      Phill Myron, D.O. 09/27/2015, 11:56 AM PGY-1, [place]

## 2015-09-27 NOTE — Assessment & Plan Note (Signed)
Given that mass under chin is soft, mobile and has been present for several years increasing in time appears consistent with a dermoid cyst.  -given size of cyst, surgery referral placed for further evaluation and removal

## 2015-09-27 NOTE — Assessment & Plan Note (Signed)
Blood pressure initially elevated to 161/86 but normalized on repeat check to 140/82.  -BMET ordered to evaluate kidney function -given longstanding history of tobacco use and initially elevated BP, will check lipid panel today to calculate ACSVD risk  -given past history of elevated glucose on prior BMETs with elevated BP as well as family history of T2DM, will obtain A1C today

## 2015-09-28 ENCOUNTER — Telehealth: Payer: Self-pay | Admitting: Internal Medicine

## 2015-09-28 LAB — LIPID PANEL
Cholesterol: 184 mg/dL (ref 125–200)
HDL: 48 mg/dL (ref >=40)
LDL Cholesterol: 125 mg/dL (ref <130)
TRIGLYCERIDES: 54 mg/dL (ref <150)
Total CHOL/HDL Ratio: 3.8 Ratio (ref <=5.0)
VLDL: 11 mg/dL (ref <30)

## 2015-09-28 LAB — BASIC METABOLIC PANEL
BUN: 12 mg/dL (ref 7–25)
CO2: 21 mmol/L (ref 20–31)
Calcium: 9.5 mg/dL (ref 8.6–10.3)
Chloride: 103 mmol/L (ref 98–110)
Creat: 1.03 mg/dL (ref 0.60–1.35)
GLUCOSE: 76 mg/dL (ref 65–99)
Potassium: 4 mmol/L (ref 3.5–5.3)
SODIUM: 139 mmol/L (ref 135–146)

## 2015-09-28 LAB — HIV ANTIBODY (ROUTINE TESTING W REFLEX): HIV 1&2 Ab, 4th Generation: NONREACTIVE

## 2015-09-28 NOTE — Telephone Encounter (Signed)
LMOVM. Please advise patient that he has an appt to have the cyst on his chin examine on 10/13/15 @ 9:15 AM at [facility]  Address: [address] [place], [place] [address] [phone]  He can call them directly if this does not work for his schedule. I will also send a letter.

## 2015-10-02 NOTE — Telephone Encounter (Signed)
Left message for patient to call about lab results. Would like to discuss initiating statin therapy given calculated ASCVD risk.   Phill Myron, D.O. 10/02/2015, 9:36 AM PGY-1, [place]

## 2015-10-04 DIAGNOSIS — L989 Disorder of the skin and subcutaneous tissue, unspecified: Secondary | ICD-10-CM

## 2015-10-04 DIAGNOSIS — D170 Benign lipomatous neoplasm of skin and subcutaneous tissue of head, face and neck: Secondary | ICD-10-CM

## 2015-10-04 HISTORY — DX: Benign lipomatous neoplasm of skin and subcutaneous tissue of head, face and neck: D17.0

## 2015-10-04 HISTORY — DX: Disorder of the skin and subcutaneous tissue, unspecified: L98.9

## 2015-10-27 ENCOUNTER — Encounter (HOSPITAL_BASED_OUTPATIENT_CLINIC_OR_DEPARTMENT_OTHER): Payer: Self-pay | Admitting: *Deleted

## 2015-10-27 DIAGNOSIS — R21 Rash and other nonspecific skin eruption: Secondary | ICD-10-CM

## 2015-10-27 HISTORY — DX: Rash and other nonspecific skin eruption: R21

## 2015-10-31 ENCOUNTER — Ambulatory Visit: Payer: Self-pay | Admitting: Physician Assistant

## 2015-11-01 ENCOUNTER — Other Ambulatory Visit: Payer: Self-pay | Admitting: Plastic Surgery

## 2015-11-02 ENCOUNTER — Ambulatory Visit (HOSPITAL_BASED_OUTPATIENT_CLINIC_OR_DEPARTMENT_OTHER)
Admission: RE | Admit: 2015-11-02 | Discharge: 2015-11-02 | Disposition: A | Discharge status: Home / Self Care | Payer: BLUE CROSS/BLUE SHIELD | Source: Ambulatory Visit | Attending: Plastic Surgery | Admitting: Plastic Surgery

## 2015-11-02 ENCOUNTER — Ambulatory Visit (HOSPITAL_BASED_OUTPATIENT_CLINIC_OR_DEPARTMENT_OTHER): Payer: BLUE CROSS/BLUE SHIELD | Admitting: Anesthesiology

## 2015-11-02 ENCOUNTER — Encounter (HOSPITAL_BASED_OUTPATIENT_CLINIC_OR_DEPARTMENT_OTHER): Payer: Self-pay | Admitting: *Deleted

## 2015-11-02 ENCOUNTER — Encounter (HOSPITAL_BASED_OUTPATIENT_CLINIC_OR_DEPARTMENT_OTHER)
Admission: RE | Disposition: A | Discharge status: Home / Self Care | Payer: Self-pay | Source: Ambulatory Visit | Attending: Plastic Surgery

## 2015-11-02 DIAGNOSIS — L989 Disorder of the skin and subcutaneous tissue, unspecified: Secondary | ICD-10-CM | POA: Diagnosis not present

## 2015-11-02 DIAGNOSIS — F17210 Nicotine dependence, cigarettes, uncomplicated: Secondary | ICD-10-CM | POA: Insufficient documentation

## 2015-11-02 DIAGNOSIS — R220 Localized swelling, mass and lump, head: Secondary | ICD-10-CM | POA: Diagnosis present

## 2015-11-02 DIAGNOSIS — L720 Epidermal cyst: Secondary | ICD-10-CM | POA: Diagnosis not present

## 2015-11-02 HISTORY — DX: Rash and other nonspecific skin eruption: R21

## 2015-11-02 HISTORY — DX: Benign lipomatous neoplasm of skin and subcutaneous tissue of head, face and neck: D17.0

## 2015-11-02 HISTORY — PX: LIPOMA EXCISION: SHX5283

## 2015-11-02 HISTORY — PX: LESION REMOVAL: SHX5196

## 2015-11-02 HISTORY — DX: Disorder of the skin and subcutaneous tissue, unspecified: L98.9

## 2015-11-02 SURGERY — EXCISION LIPOMA
Anesthesia: General | Site: Nose

## 2015-11-02 MED ORDER — LIDOCAINE HCL (CARDIAC) 20 MG/ML IV SOLN
INTRAVENOUS | Status: AC
  Filled 2015-11-02: qty 5

## 2015-11-02 MED ORDER — SCOPOLAMINE 1 MG/3DAYS TD PT72: 1.0000 | MEDICATED_PATCH | Freq: Once | TRANSDERMAL | Status: DC | PRN

## 2015-11-02 MED ORDER — ONDANSETRON HCL 4 MG/2ML IJ SOLN
INTRAMUSCULAR | Status: DC | PRN
  Administered 2015-11-02: 4 mg via INTRAVENOUS

## 2015-11-02 MED ORDER — EPHEDRINE SULFATE 50 MG/ML IJ SOLN
INTRAMUSCULAR | Status: DC | PRN
  Administered 2015-11-02: 10 mg via INTRAVENOUS

## 2015-11-02 MED ORDER — BUPIVACAINE-EPINEPHRINE 0.25% -1:200000 IJ SOLN
INTRAMUSCULAR | Status: DC | PRN
  Administered 2015-11-02: 2 mL

## 2015-11-02 MED ORDER — OXYCODONE HCL 5 MG PO TABS
5.0000 mg | ORAL_TABLET | Freq: Once | ORAL | Status: AC | PRN
  Administered 2015-11-02: 5 mg via ORAL

## 2015-11-02 MED ORDER — FENTANYL CITRATE (PF) 100 MCG/2ML IJ SOLN
INTRAMUSCULAR | Status: AC
  Filled 2015-11-02: qty 2

## 2015-11-02 MED ORDER — DEXAMETHASONE SODIUM PHOSPHATE 4 MG/ML IJ SOLN
INTRAMUSCULAR | Status: DC | PRN
  Administered 2015-11-02: 10 mg via INTRAVENOUS

## 2015-11-02 MED ORDER — FENTANYL CITRATE (PF) 100 MCG/2ML IJ SOLN
50.0000 ug | INTRAMUSCULAR | Status: DC | PRN
  Administered 2015-11-02: 100 ug via INTRAVENOUS

## 2015-11-02 MED ORDER — MIDAZOLAM HCL 2 MG/2ML IJ SOLN
INTRAMUSCULAR | Status: AC
  Filled 2015-11-02: qty 2

## 2015-11-02 MED ORDER — PROPOFOL 10 MG/ML IV BOLUS
INTRAVENOUS | Status: DC | PRN
  Administered 2015-11-02: 300 mg via INTRAVENOUS

## 2015-11-02 MED ORDER — KETOROLAC TROMETHAMINE 30 MG/ML IJ SOLN: 30.0000 mg | Freq: Once | INTRAMUSCULAR | Status: DC

## 2015-11-02 MED ORDER — OXYCODONE HCL 5 MG PO TABS
ORAL_TABLET | ORAL | Status: AC
  Filled 2015-11-02: qty 1

## 2015-11-02 MED ORDER — LACTATED RINGERS IV SOLN
INTRAVENOUS | Status: DC
  Administered 2015-11-02 (×2): via INTRAVENOUS

## 2015-11-02 MED ORDER — CEFAZOLIN SODIUM-DEXTROSE 2-4 GM/100ML-% IV SOLN
INTRAVENOUS | Status: AC
Start: 2015-11-02 — End: 2015-11-02
  Filled 2015-11-02: qty 100

## 2015-11-02 MED ORDER — ONDANSETRON HCL 4 MG/2ML IJ SOLN: 4.0000 mg | Freq: Once | INTRAMUSCULAR | Status: DC | PRN

## 2015-11-02 MED ORDER — GLYCOPYRROLATE 0.2 MG/ML IJ SOLN: 0.2000 mg | Freq: Once | INTRAMUSCULAR | Status: DC | PRN

## 2015-11-02 MED ORDER — DEXAMETHASONE SODIUM PHOSPHATE 10 MG/ML IJ SOLN
INTRAMUSCULAR | Status: AC
  Filled 2015-11-02: qty 1

## 2015-11-02 MED ORDER — MIDAZOLAM HCL 2 MG/2ML IJ SOLN
1.0000 mg | INTRAMUSCULAR | Status: DC | PRN
  Administered 2015-11-02: 2 mg via INTRAVENOUS

## 2015-11-02 MED ORDER — CEFAZOLIN SODIUM-DEXTROSE 2-4 GM/100ML-% IV SOLN
2.0000 g | INTRAVENOUS | Status: AC
  Administered 2015-11-02: 2 g via INTRAVENOUS

## 2015-11-02 MED ORDER — FENTANYL CITRATE (PF) 100 MCG/2ML IJ SOLN: 25.0000 ug | INTRAMUSCULAR | Status: DC | PRN

## 2015-11-02 MED ORDER — ONDANSETRON HCL 4 MG/2ML IJ SOLN
INTRAMUSCULAR | Status: AC
  Filled 2015-11-02: qty 2

## 2015-11-02 MED ORDER — MEPERIDINE HCL 25 MG/ML IJ SOLN: 6.2500 mg | INTRAMUSCULAR | Status: DC | PRN

## 2015-11-02 MED ORDER — LIDOCAINE HCL (CARDIAC) 20 MG/ML IV SOLN
INTRAVENOUS | Status: DC | PRN
  Administered 2015-11-02: 50 mg via INTRAVENOUS

## 2015-11-02 SURGICAL SUPPLY — 75 items
ADH SKN CLS APL DERMABOND .7 (GAUZE/BANDAGES/DRESSINGS)
BLADE CLIPPER SURG (BLADE) IMPLANT
BLADE HEX COATED 2.75 (ELECTRODE) IMPLANT
BLADE SURG 15 STRL LF DISP TIS (BLADE) ×2 IMPLANT
BLADE SURG 15 STRL SS (BLADE) ×4
BNDG CONFORM 2 STRL LF (GAUZE/BANDAGES/DRESSINGS) IMPLANT
BNDG ELASTIC 2X5.8 VLCR STR LF (GAUZE/BANDAGES/DRESSINGS) IMPLANT
CANISTER SUCT 1200ML W/VALVE (MISCELLANEOUS) IMPLANT
CHLORAPREP W/TINT 26ML (MISCELLANEOUS) IMPLANT
CLEANER CAUTERY TIP 5X5 PAD (MISCELLANEOUS) IMPLANT
CLOSURE WOUND 1/2 X4 (GAUZE/BANDAGES/DRESSINGS)
CORDS BIPOLAR (ELECTRODE) IMPLANT
COVER BACK TABLE 60X90IN (DRAPES) ×4 IMPLANT
COVER MAYO STAND STRL (DRAPES) ×4 IMPLANT
DECANTER SPIKE VIAL GLASS SM (MISCELLANEOUS) ×4 IMPLANT
DERMABOND ADVANCED (GAUZE/BANDAGES/DRESSINGS)
DERMABOND ADVANCED .7 DNX12 (GAUZE/BANDAGES/DRESSINGS) IMPLANT
DRAPE LAPAROTOMY 100X72 PEDS (DRAPES) IMPLANT
DRAPE U-SHAPE 76X120 STRL (DRAPES) ×4 IMPLANT
DRSG TEGADERM 2-3/8X2-3/4 SM (GAUZE/BANDAGES/DRESSINGS) IMPLANT
ELECT COATED BLADE 2.86 ST (ELECTRODE) ×2 IMPLANT
ELECT NDL BLADE 2-5/6 (NEEDLE) ×2 IMPLANT
ELECT NEEDLE BLADE 2-5/6 (NEEDLE) ×4 IMPLANT
ELECT REM PT RETURN 9FT ADLT (ELECTROSURGICAL) ×4
ELECT REM PT RETURN 9FT PED (ELECTROSURGICAL)
ELECTRODE REM PT RETRN 9FT PED (ELECTROSURGICAL) IMPLANT
ELECTRODE REM PT RTRN 9FT ADLT (ELECTROSURGICAL) ×1 IMPLANT
GAUZE XEROFORM 1X8 LF (GAUZE/BANDAGES/DRESSINGS) IMPLANT
GLOVE BIO SURGEON STRL SZ 6.5 (GLOVE) ×9 IMPLANT
GLOVE BIO SURGEON STRL SZ7 (GLOVE) ×4 IMPLANT
GLOVE BIO SURGEONS STRL SZ 6.5 (GLOVE) ×3
GOWN STRL REUS W/ TWL LRG LVL3 (GOWN DISPOSABLE) ×4 IMPLANT
GOWN STRL REUS W/ TWL XL LVL3 (GOWN DISPOSABLE) IMPLANT
GOWN STRL REUS W/TWL LRG LVL3 (GOWN DISPOSABLE) ×8
GOWN STRL REUS W/TWL XL LVL3 (GOWN DISPOSABLE) ×4
LIQUID BAND (GAUZE/BANDAGES/DRESSINGS) ×3 IMPLANT
NDL HYPO 30GX1 BEV (NEEDLE) ×2 IMPLANT
NDL PRECISIONGLIDE 27X1.5 (NEEDLE) IMPLANT
NEEDLE HYPO 30GX1 BEV (NEEDLE) ×4 IMPLANT
NEEDLE PRECISIONGLIDE 27X1.5 (NEEDLE) IMPLANT
NS IRRIG 1000ML POUR BTL (IV SOLUTION) IMPLANT
PACK BASIN DAY SURGERY FS (CUSTOM PROCEDURE TRAY) ×4 IMPLANT
PAD CLEANER CAUTERY TIP 5X5 (MISCELLANEOUS)
PENCIL BUTTON HOLSTER BLD 10FT (ELECTRODE) ×2 IMPLANT
RUBBERBAND STERILE (MISCELLANEOUS) IMPLANT
SHEET MEDIUM DRAPE 40X70 STRL (DRAPES) IMPLANT
SPONGE GAUZE 2X2 8PLY STER LF (GAUZE/BANDAGES/DRESSINGS)
SPONGE GAUZE 2X2 8PLY STRL LF (GAUZE/BANDAGES/DRESSINGS) IMPLANT
SPONGE GAUZE 4X4 12PLY STER LF (GAUZE/BANDAGES/DRESSINGS) IMPLANT
STRIP CLOSURE SKIN 1/2X4 (GAUZE/BANDAGES/DRESSINGS) IMPLANT
SUCTION FRAZIER HANDLE 10FR (MISCELLANEOUS)
SUCTION TUBE FRAZIER 10FR DISP (MISCELLANEOUS) IMPLANT
SUT CHROMIC 4 0 P 3 18 (SUTURE) IMPLANT
SUT ETHILON 4 0 PS 2 18 (SUTURE) IMPLANT
SUT ETHILON 5 0 P 3 18 (SUTURE)
SUT MNCRL 6-0 UNDY P1 1X18 (SUTURE) IMPLANT
SUT MNCRL AB 4-0 PS2 18 (SUTURE) IMPLANT
SUT MON AB 5-0 P3 18 (SUTURE) IMPLANT
SUT MON AB 5-0 PS2 18 (SUTURE) ×3 IMPLANT
SUT MONOCRYL 6-0 P1 1X18 (SUTURE)
SUT NYLON ETHILON 5-0 P-3 1X18 (SUTURE) IMPLANT
SUT PLAIN 5 0 P 3 18 (SUTURE) IMPLANT
SUT PROLENE 5 0 P 3 (SUTURE) IMPLANT
SUT PROLENE 5 0 PS 2 (SUTURE) IMPLANT
SUT PROLENE 6 0 P 1 18 (SUTURE) ×4 IMPLANT
SUT VIC AB 5-0 P-3 18X BRD (SUTURE) IMPLANT
SUT VIC AB 5-0 P3 18 (SUTURE)
SUT VIC AB 5-0 PS2 18 (SUTURE) IMPLANT
SUT VICRYL 4-0 PS2 18IN ABS (SUTURE) IMPLANT
SYR BULB 3OZ (MISCELLANEOUS) IMPLANT
SYR CONTROL 10ML LL (SYRINGE) ×4 IMPLANT
TOWEL OR 17X24 6PK STRL BLUE (TOWEL DISPOSABLE) ×4 IMPLANT
TRAY DSU PREP LF (CUSTOM PROCEDURE TRAY) ×4 IMPLANT
TUBE CONNECTING 20'X1/4 (TUBING)
TUBE CONNECTING 20X1/4 (TUBING) IMPLANT

## 2015-11-02 NOTE — Brief Op Note (Signed)
11/02/2015  8:59 AM  PATIENT:  Bradley Haas  50 y.o. male  PRE-OPERATIVE DIAGNOSIS:  NASAL SKIN LESION AND NECK LIPOMA  POST-OPERATIVE DIAGNOSIS:  NASAL SKIN LESION AND NECK LESION  PROCEDURE:  Procedure(s): EXCISION OF NECK LESION (N/A) NASAL LESION REMOVAL (N/A)  SURGEON:  Surgeon(s) and Role:    * Claire S Dillingham, DO - Primary  PHYSICIAN ASSISTANT: Shawn Rayburn, PA  ASSISTANTS: none   ANESTHESIA:   general  EBL:  Total I/O In: 500 [I.V.:500] Out: 5 [Blood:5]  BLOOD ADMINISTERED:none  DRAINS: none   LOCAL MEDICATIONS USED:  MARCAINE     SPECIMEN:  Source of Specimen:  chin lesion and nose lesion  DISPOSITION OF SPECIMEN:  PATHOLOGY  COUNTS:  YES  TOURNIQUET:  * No tourniquets in log *  DICTATION: .Dragon Dictation  PLAN OF CARE: Discharge to home after PACU  PATIENT DISPOSITION:  PACU - hemodynamically stable.   Delay start of Pharmacological VTE agent (>24hrs) due to surgical blood loss or risk of bleeding: no

## 2015-11-02 NOTE — Discharge Instructions (Signed)
May shower tomorrow No heavy lifting   Post Anesthesia Home Care Instructions  Activity: Get plenty of rest for the remainder of the day. A responsible adult should stay with you for 24 hours following the procedure.  For the next 24 hours, DO NOT: -Drive a car -Paediatric nurse -Drink alcoholic beverages -Take any medication unless instructed by your physician -Make any legal decisions or sign important papers.  Meals: Start with liquid foods such as gelatin or soup. Progress to regular foods as tolerated. Avoid greasy, spicy, heavy foods. If nausea and/or vomiting occur, drink only clear liquids until the nausea and/or vomiting subsides. Call your physician if vomiting continues.  Special Instructions/Symptoms: Your throat may feel dry or sore from the anesthesia or the breathing tube placed in your throat during surgery. If this causes discomfort, gargle with warm salt water. The discomfort should disappear within 24 hours.  If you had a scopolamine patch placed behind your ear for the management of post- operative nausea and/or vomiting:  1. The medication in the patch is effective for 72 hours, after which it should be removed.  Wrap patch in a tissue and discard in the trash. Wash hands thoroughly with soap and water. 2. You may remove the patch earlier than 72 hours if you experience unpleasant side effects which may include dry mouth, dizziness or visual disturbances. 3. Avoid touching the patch. Wash your hands with soap and water after contact with the patch.

## 2015-11-02 NOTE — Anesthesia Preprocedure Evaluation (Addendum)
Anesthesia Evaluation  Patient identified by MRN, date of birth, ID band Patient awake    Reviewed: Allergy & Precautions, H&P , NPO status , Patient's Chart, lab work & pertinent test results  Airway Mallampati: I  TM Distance: >3 FB Neck ROM: full    Dental  (+) Poor Dentition,    Pulmonary Current Smoker,    Pulmonary exam normal        Cardiovascular negative cardio ROS Normal cardiovascular exam     Neuro/Psych negative neurological ROS  negative psych ROS   GI/Hepatic negative GI ROS, Neg liver ROS,   Endo/Other  negative endocrine ROS  Renal/GU negative Renal ROS     Musculoskeletal   Abdominal Normal abdominal exam  (+)   Peds  Hematology negative hematology ROS (+)   Anesthesia Other Findings   Reproductive/Obstetrics negative OB ROS                            Anesthesia Physical Anesthesia Plan  ASA: II  Anesthesia Plan: General   Post-op Pain Management:    Induction: Intravenous  Airway Management Planned: LMA  Additional Equipment:   Intra-op Plan:   Post-operative Plan:   Informed Consent: I have reviewed the patients History and Physical, chart, labs and discussed the procedure including the risks, benefits and alternatives for the proposed anesthesia with the patient or authorized representative who has indicated his/her understanding and acceptance.     Plan Discussed with: CRNA and Surgeon  Anesthesia Plan Comments:         Anesthesia Quick Evaluation

## 2015-11-02 NOTE — H&P (Signed)
Bradley Haas is an 50 y.o. male.   Chief Complaint: changing skin lesion HPI: The patient is a 50 yrs old bm here for treatment of a changing pigmented skin lesion on his nose and a mass under his chin. The nose lesion has been there for years and is getting larger. It is dark, raised and seems to be changing in color and size with time. It is on the right ala of the nose and is 2 cm in size. The mass under his chin is to the left of midline, raised, soft, not fixed and not tender. It feels like a cyst or a lipoma. There has been no drainage, no pain and it is getting larger with time. He is a smoker but otherwise healthy. The area is 3 x 3 cm in size. He does not have any other palpable lymph nodes. We discussed the possibilities of a submandibular gland tumor and that it would require more surgery if this is the case.   Past Medical History  Diagnosis Date  . Facial skin lesion 10/2015    nose  . 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 Rash 10/27/2015    legs    Past Surgical History  Procedure Laterality Date  . No past surgeries      History reviewed. No pertinent family history. Social History:  reports that he has been smoking Cigarettes.  He has a 20 pack-year smoking history. He has never used smokeless tobacco. He reports that he drinks alcohol. He reports that he uses illicit drugs (Marijuana) about 7 times per week.  Allergies: No Known Allergies  No prescriptions prior to admission    No results found for this or any previous visit (from the past 48 hour(s)). No results found.  Review of Systems  Constitutional: Negative.   HENT: Negative.   Eyes: Negative.   Respiratory: Negative.   Cardiovascular: Negative.   Gastrointestinal: Negative.   Genitourinary: Negative.   Musculoskeletal: Negative.   Skin: Negative.   Psychiatric/Behavioral: Negative.     Temperature 98.1 F (36.7 C), temperature source Oral, height 5\' 9"  (1.753 m), weight 67.586 kg (149  lb). Physical Exam  Constitutional: He is oriented to person, place, and time. He appears well-developed and well-nourished.  HENT:  Head: Normocephalic and atraumatic.  Eyes: Conjunctivae and EOM are normal. Pupils are equal, round, and reactive to light.  Cardiovascular: Normal rate.   Respiratory: Effort normal.  GI: Soft.  Neurological: He is alert and oriented to person, place, and time.  Skin: Skin is warm.  Psychiatric: He has a normal mood and affect. His behavior is normal. Judgment and thought content normal.     Assessment/Plan Recommend excision of nasal changing skin lesion and mass under chin.  Will send everything to pathology.  Wallace Going, DO 11/02/2015, 7:11 AM

## 2015-11-02 NOTE — Op Note (Signed)
Operative Note   DATE OF OPERATION: 11/02/2015  LOCATION: [facility]  SURGICAL DIVISION: Plastic Surgery  PREOPERATIVE DIAGNOSES:  Mass / cyst of submandible area, changing skin lesion of nose  POSTOPERATIVE DIAGNOSES:  same  PROCEDURE:  Excision of submandibular cyst 4 x 4 cm with layered closure,  Excision of changing skin lesion of nose 1 cm  SURGEON: Claire Sanger Dillingham, DO  ASSISTANT: Shawn Rayburn, PA  ANESTHESIA:  General.   COMPLICATIONS: None.   INDICATIONS FOR PROCEDURE:  The patient, Bradley Haas is a 50 y.o. male born on 1965/10/10, is here for treatment of a changing skin lesion of the nose and growing mass under the chin in the submandibular area. MRN: [id number]  CONSENT:  Informed consent was obtained directly from the patient. Risks, benefits and alternatives were fully discussed. Specific risks including but not limited to bleeding, infection, hematoma, seroma, scarring, pain, infection, contracture, asymmetry, wound healing problems, and need for further surgery were all discussed. The patient did have an ample opportunity to have questions answered to satisfaction.   DESCRIPTION OF PROCEDURE:  The patient was taken to the operating room. SCDs were placed and IV antibiotics were given. The patient's operative site was prepped and draped in a sterile fashion. A time out was performed and all information was confirmed to be correct.  General anesthesia was administered.  The area was injected with local for intraoperative hemostasis and postoperative pain control.  The #15 blade was used to make a skin incision over the chin lesion.  The area looked like a sebaceous cyst.  The entire lesion 4 x 4 cm was removed with the sac.  The area was irrigated.  Hemostasis was achieved with electrocautery.  The deep layer was closed with 5-0 Monocryl and a running subcuticular 6-0 Monocryl was used to close the skin.  The nose was also injected with local  at the right ala.  The #15 blade was used to excise the lesion 1 cm.  The skin was closed with 6-0 Monocryl. dermabond was applied to the chin lesion.   The patient tolerated the procedure well.  There were no complications. The patient was allowed to wake from anesthesia, extubated and taken to the recovery room in satisfactory condition.

## 2015-11-02 NOTE — Transfer of Care (Signed)
Immediate Anesthesia Transfer of Care Note  Patient: Bradley Haas  Procedure(s) Performed: Procedure(s): EXCISION OF NECK LESION (N/A) NASAL LESION REMOVAL (N/A)  Patient Location: PACU  Anesthesia Type:General  Level of Consciousness: awake  Airway & Oxygen Therapy: Patient Spontanous Breathing and Patient connected to face mask oxygen  Post-op Assessment: Report given to RN and Post -op Vital signs reviewed and stable  Post vital signs: Reviewed and stable  Last Vitals:  Filed Vitals:   11/02/15 0710  BP: 138/75  Pulse: 50  Temp: 36.7 C  Resp: 20    Complications: No apparent anesthesia complications

## 2015-11-02 NOTE — Anesthesia Procedure Notes (Signed)
Procedure Name: LMA Insertion Date/Time: 11/02/2015 8:29 AM Performed by: Lieutenant Diego Pre-anesthesia Checklist: Patient identified, Emergency Drugs available, Suction available and Patient being monitored Patient Re-evaluated:Patient Re-evaluated prior to inductionOxygen Delivery Method: Circle System Utilized Preoxygenation: Pre-oxygenation with 100% oxygen Intubation Type: IV induction Ventilation: Mask ventilation without difficulty LMA: LMA flexible inserted LMA Size: 5.0 Number of attempts: 1 Airway Equipment and Method: Bite block Placement Confirmation: positive ETCO2 and breath sounds checked- equal and bilateral Tube secured with: Tape Dental Injury: Teeth and Oropharynx as per pre-operative assessment

## 2015-11-02 NOTE — Anesthesia Postprocedure Evaluation (Signed)
Anesthesia Post Note  Patient: Bradley Haas  Procedure(s) Performed: Procedure(s) (LRB): EXCISION OF NECK LESION (N/A) NASAL LESION REMOVAL (N/A)  Patient location during evaluation: PACU Anesthesia Type: General Level of consciousness: awake Pain management: pain level controlled Vital Signs Assessment: post-procedure vital signs reviewed and stable Respiratory status: spontaneous breathing Cardiovascular status: stable Postop Assessment: no signs of nausea or vomiting Anesthetic complications: no    Last Vitals:  Filed Vitals:   11/02/15 0930 11/02/15 1012  BP: 123/76 134/84  Pulse: 46 43  Temp:  36.7 C  Resp: 15 18    Last Pain:  Filed Vitals:   11/02/15 1015  PainSc: [place]

## 2015-11-02 NOTE — Interval H&P Note (Signed)
History and Physical Interval Note:  11/02/2015 8:00 AM  Bradley Haas  has presented today for surgery, with the diagnosis of NASAL SKIN LESION AND NECK LIPOMA  The various methods of treatment have been discussed with the patient and family. After consideration of risks, benefits and other options for treatment, the patient has consented to  Procedure(s): EXCISION OF NECK LIPOMA (N/A) NASAL LESION REMOVAL (N/A) as a surgical intervention .  The patient's history has been reviewed, patient examined, no change in status, stable for surgery.  I have reviewed the patient's chart and labs.  Questions were answered to the patient's satisfaction.     Wallace Going

## 2015-11-03 ENCOUNTER — Encounter (HOSPITAL_BASED_OUTPATIENT_CLINIC_OR_DEPARTMENT_OTHER): Payer: Self-pay | Admitting: Plastic Surgery

## 2016-04-03 ENCOUNTER — Ambulatory Visit (INDEPENDENT_AMBULATORY_CARE_PROVIDER_SITE_OTHER): Payer: Self-pay | Admitting: Internal Medicine

## 2016-04-03 ENCOUNTER — Encounter: Payer: Self-pay | Admitting: Internal Medicine

## 2016-04-03 DIAGNOSIS — L0201 Cutaneous abscess of face: Secondary | ICD-10-CM

## 2016-04-03 NOTE — Progress Notes (Signed)
   Subjective:    Bradley Haas - 50 y.o. male MRN [id number]  Date of birth: 1966-06-01  HPI  Bradley Samples Medico is here for SDA for lump on face.   Lump on Face: Present in corner of nose on right side. Present for past 1-2 weeks and has been increasing in size. He has been placing warm compresses to the area. It developed a head and then began to drain white-yellow thick material. Area is very tender and has been red in the surrounding area. Patient has been trying to squeeze material out of it. Denies fevers/chills and nausea/vomiting. Reports headache.     -  reports that he has been smoking Cigarettes.  He has a 20.00 pack-year smoking history. He has never used smokeless tobacco. - Review of Systems: Per HPI. - Past Medical History: Patient Active Problem List   Diagnosis Date Noted  . Abscess of face 04/03/2016  . Changing skin lesion 11/02/2015  . Dermoid cyst 09/27/2015  . Elevated blood pressure 09/27/2015  . Screening for STD (sexually transmitted disease) 09/27/2015  . Tobacco abuse 09/27/2015   - Medications: reviewed and updated    Objective:   Physical Exam BP (!) 144/86   Pulse (!) 51   Temp 99 F (37.2 C) (Oral)   Ht 5\' 9"  (1.753 m)   Wt 150 lb 3.2 oz (68.1 kg)   BMI 22.18 kg/m  Gen: NAD, alert, cooperative with exam, well-appearing Skin: Abscess in right corner of nose actively draining yellow discharge/blood with small area of fluctuance and surrounding induration. Surrounding erythema present. TTP.      Assessment & Plan:   Abscess of face Exam consistent with an abscess. Given location in the danger triangle of the face, will defer to ENT for management.  -called ENT and patient able to go directly to [facility] to their office for management     Phill Myron, D.O. 04/03/2016, 4:29 PM PGY-2, [place];

## 2016-04-03 NOTE — Assessment & Plan Note (Signed)
Exam consistent with an abscess. Given location in the danger triangle of the face, will defer to ENT for management.  -called ENT and patient able to go directly to [facility] to their office for management

## 2017-08-02 ENCOUNTER — Emergency Department (HOSPITAL_COMMUNITY)
Admission: EM | Admit: 2017-08-02 | Discharge: 2017-08-02 | Disposition: A | Discharge status: Home / Self Care | Payer: BLUE CROSS/BLUE SHIELD | Attending: Emergency Medicine | Admitting: Emergency Medicine

## 2017-08-02 ENCOUNTER — Other Ambulatory Visit: Payer: Self-pay

## 2017-08-02 DIAGNOSIS — K029 Dental caries, unspecified: Secondary | ICD-10-CM | POA: Insufficient documentation

## 2017-08-02 DIAGNOSIS — F17210 Nicotine dependence, cigarettes, uncomplicated: Secondary | ICD-10-CM | POA: Diagnosis not present

## 2017-08-02 DIAGNOSIS — K0889 Other specified disorders of teeth and supporting structures: Secondary | ICD-10-CM | POA: Diagnosis present

## 2017-08-02 DIAGNOSIS — K047 Periapical abscess without sinus: Secondary | ICD-10-CM

## 2017-08-02 MED ORDER — NAPROXEN 500 MG PO TABS: 500.0000 mg | ORAL_TABLET | Freq: Two times a day (BID) | ORAL | 0 refills | Status: DC

## 2017-08-02 MED ORDER — AMOXICILLIN 500 MG PO CAPS: 500.0000 mg | ORAL_CAPSULE | Freq: Three times a day (TID) | ORAL | 0 refills | Status: DC

## 2017-08-02 NOTE — ED Provider Notes (Signed)
[facility] DEPT Provider Note   CSN: [id number] Arrival date & time: 08/02/17  1037     History   Chief Complaint Chief Complaint  Patient presents with  . Dental Pain    HPI Bradley Haas is a 51 y.o. male who presents to the ED with dental pain. The pain started 3 days ago and patient reports is so bad now he has difficulty eating. The pain is located in the left upper molar region. He reports the pain as 10/10. Patient states that he will have dental insurance starting Jan 1st and is trying to get in with a dentist. Patient states he had facial swelling a few days ago but that went away.  HPI  Past Medical History:  Diagnosis Date  . Facial skin lesion 10/2015   nose  . 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 Rash 10/27/2015   legs    Patient Active Problem List   Diagnosis Date Noted  . Abscess of face 04/03/2016  . Changing skin lesion 11/02/2015  . Dermoid cyst 09/27/2015  . Elevated blood pressure 09/27/2015  . Screening for STD (sexually transmitted disease) 09/27/2015  . Tobacco abuse 09/27/2015    Past Surgical History:  Procedure Laterality Date  . LESION REMOVAL N/A 11/02/2015   Procedure: NASAL LESION REMOVAL;  Surgeon: Wallace Going, DO;  Location: [facility];  Service: Plastics;  Laterality: N/A;  . LIPOMA EXCISION N/A 11/02/2015   Procedure: EXCISION OF NECK LESION;  Surgeon: Wallace Going, DO;  Location: [facility];  Service: Plastics;  Laterality: N/A;  . NO PAST SURGERIES         Home Medications    Prior to Admission medications   Medication Sig Start Date End Date Taking? Authorizing Provider  amoxicillin (AMOXIL) 500 MG capsule Take 1 capsule (500 mg total) by mouth 3 (three) times daily. 08/02/17   Ashley Murrain, NP  naproxen (NAPROSYN) 500 MG tablet Take 1 tablet (500 mg total) by mouth 2 (two) times daily. 08/02/17   Ashley Murrain, NP    Family History No family  history on file.  Social History Social History   Tobacco Use  . Smoking status: Current Every Day Smoker    Packs/day: 1.00    Years: 20.00    Pack years: 20.00    Types: Cigarettes  . Smokeless tobacco: Never Used  Substance Use Topics  . Alcohol use: Yes    Comment: occasionally  . Drug use: Yes    Frequency: 7.0 times per week    Types: Marijuana    Comment: last used 10/26/2015 - advised to stop 3 days prior to surgery     Allergies   Patient has no known allergies.   Review of Systems Review of Systems  Constitutional: Negative for chills and fever.  HENT: Positive for dental problem. Negative for ear pain, sore throat and trouble swallowing.   Eyes: Negative for pain, discharge and redness.  Respiratory: Negative for cough and shortness of breath.   Cardiovascular: Negative for chest pain.  Gastrointestinal: Negative for abdominal pain, nausea and vomiting.  Musculoskeletal: Negative for back pain and neck stiffness.  Skin: Negative for rash.  Neurological: Positive for headaches.  Psychiatric/Behavioral: Negative for confusion.     Physical Exam Updated Vital Signs BP (!) 163/86 (BP Location: Left Arm)   Pulse (!) 48   Temp 98.6 F (37 C) (Oral)   Resp 18  Ht 5\' 9"  (1.753 m)   Wt 68 kg (150 lb)   SpO2 98%   BMI 22.15 kg/m   Physical Exam  Constitutional: He appears well-developed and well-nourished. No distress.  HENT:  Head: Normocephalic and atraumatic.  Right Ear: Tympanic membrane normal.  Left Ear: Tympanic membrane normal.  Nose: Nose normal.  Mouth/Throat: Oropharynx is clear and moist. No trismus in the jaw. Dental caries present.    Eyes: EOM are normal. Pupils are equal, round, and reactive to light.  Neck: Neck supple.  Cardiovascular: Regular rhythm. Bradycardia present.  Pulmonary/Chest: Effort normal and breath sounds normal.  Musculoskeletal: Normal range of motion.  Lymphadenopathy:    He has no cervical adenopathy.    Neurological: He is alert.  Skin: Skin is warm and dry.  Psychiatric: He has a normal mood and affect.  Nursing note and vitals reviewed.    ED Treatments / Results  Labs (all labs ordered are listed, but only abnormal results are displayed) Labs Reviewed - No data to display  Radiology No results found.  Procedures Procedures (including critical care time)  Medications Ordered in ED Medications - No data to display   Initial Impression / Assessment and Plan / ED Course  I have reviewed the triage vital signs and the nursing notes. Patient with toothache.  No gross abscess.  Exam unconcerning for Ludwig's angina or spread of infection.  Will treat with amoxicillin and anti-inflammatories medicine.  Urged patient to follow-up with dentist.  Dental resource given.  Final Clinical Impressions(s) / ED Diagnoses   Final diagnoses:  Infected dental caries    ED Discharge Orders        Ordered    amoxicillin (AMOXIL) 500 MG capsule  3 times daily     08/02/17 1403    naproxen (NAPROSYN) 500 MG tablet  2 times daily     08/02/17 1403       Debroah Baller [place], [place] 08/02/17 1407    Sherwood Gambler, MD 08/02/17 1701

## 2017-08-02 NOTE — Discharge Instructions (Signed)
Follow-up with a dentist as soon as possible.

## 2017-08-02 NOTE — ED Triage Notes (Signed)
Pt reports he has terrible dental pain and cannot eat well with it.  Pt has a white looking discharge on the left upper molar region. Pt reports the pain 10/10 Pt reports that his dental insurance kicks in on the 1st on January and he is trying to get to the dentist then.  Pt reports the pain has been going on since Wednesday. Pt is tearful in triage.

## 2021-11-01 ENCOUNTER — Telehealth (HOSPITAL_COMMUNITY): Payer: Self-pay | Admitting: Emergency Medicine

## 2021-11-01 ENCOUNTER — Encounter (HOSPITAL_COMMUNITY): Payer: Self-pay | Admitting: Emergency Medicine

## 2021-11-01 ENCOUNTER — Ambulatory Visit (HOSPITAL_COMMUNITY)
Admission: EM | Admit: 2021-11-01 | Discharge: 2021-11-01 | Disposition: A | Discharge status: Home / Self Care | Payer: BLUE CROSS/BLUE SHIELD | Attending: Physician Assistant | Admitting: Physician Assistant

## 2021-11-01 DIAGNOSIS — R1010 Upper abdominal pain, unspecified: Secondary | ICD-10-CM | POA: Diagnosis not present

## 2021-11-01 DIAGNOSIS — R112 Nausea with vomiting, unspecified: Secondary | ICD-10-CM | POA: Insufficient documentation

## 2021-11-01 DIAGNOSIS — R030 Elevated blood-pressure reading, without diagnosis of hypertension: Secondary | ICD-10-CM | POA: Diagnosis not present

## 2021-11-01 LAB — COMPREHENSIVE METABOLIC PANEL
ALT: 9 U/L (ref 0–44)
AST: 16 U/L (ref 15–41)
Albumin: 3.8 g/dL (ref 3.5–5.0)
Alkaline Phosphatase: 56 U/L (ref 38–126)
Anion gap: 9 (ref 5–15)
BUN: 9 mg/dL (ref 6–20)
CO2: 25 mmol/L (ref 22–32)
Calcium: 9.1 mg/dL (ref 8.9–10.3)
Chloride: 103 mmol/L (ref 98–111)
Creatinine, Ser: 1.1 mg/dL (ref 0.61–1.24)
GFR, Estimated: >60 mL/min (ref >60)
Glucose, Bld: 135 mg/dL — ABNORMAL HIGH (ref 70–99)
Potassium: 2.9 mmol/L — ABNORMAL LOW (ref 3.5–5.1)
Sodium: 137 mmol/L (ref 135–145)
Total Bilirubin: 0.4 mg/dL (ref 0.3–1.2)
Total Protein: 6.6 g/dL (ref 6.5–8.1)

## 2021-11-01 LAB — CBC WITH DIFFERENTIAL/PLATELET
Abs Immature Granulocytes: 0.04 10*3/uL (ref 0.00–0.07)
Basophils Absolute: 0.1 10*3/uL (ref 0.0–0.1)
Basophils Relative: 0 %
Eosinophils Absolute: 0.6 10*3/uL — ABNORMAL HIGH (ref 0.0–0.5)
Eosinophils Relative: 6 %
HCT: 40.5 % (ref 39.0–52.0)
Hemoglobin: 13.7 g/dL (ref 13.0–17.0)
Immature Granulocytes: 0 %
Lymphocytes Relative: 30 %
Lymphs Abs: 3.4 10*3/uL (ref 0.7–4.0)
MCH: 31.3 pg (ref 26.0–34.0)
MCHC: 33.8 g/dL (ref 30.0–36.0)
MCV: 92.5 fL (ref 80.0–100.0)
Monocytes Absolute: 0.6 10*3/uL (ref 0.1–1.0)
Monocytes Relative: 6 %
Neutro Abs: 6.5 10*3/uL (ref 1.7–7.7)
Neutrophils Relative %: 58 %
Platelets: 309 10*3/uL (ref 150–400)
RBC: 4.38 MIL/uL (ref 4.22–5.81)
RDW: 13.6 % (ref 11.5–15.5)
WBC: 11.2 10*3/uL — ABNORMAL HIGH (ref 4.0–10.5)
nRBC: 0 % (ref 0.0–0.2)

## 2021-11-01 LAB — LIPASE, BLOOD: Lipase: 35 U/L (ref 11–51)

## 2021-11-01 MED ORDER — ONDANSETRON 4 MG PO TBDP
4.0000 mg | ORAL_TABLET | Freq: Once | ORAL | Status: AC
  Administered 2021-11-01: 4 mg via ORAL

## 2021-11-01 MED ORDER — ONDANSETRON 4 MG PO TBDP
ORAL_TABLET | ORAL | Status: AC
  Filled 2021-11-01: qty 1

## 2021-11-01 MED ORDER — ALUM & MAG HYDROXIDE-SIMETH 200-200-20 MG/5ML PO SUSP
ORAL | Status: AC
  Filled 2021-11-01: qty 30

## 2021-11-01 MED ORDER — POTASSIUM CHLORIDE ER 10 MEQ PO TBCR: 10.0000 meq | EXTENDED_RELEASE_TABLET | Freq: Two times a day (BID) | ORAL | 0 refills | Status: DC

## 2021-11-01 MED ORDER — SUCRALFATE 1 G PO TABS: 1.0000 g | ORAL_TABLET | Freq: Three times a day (TID) | ORAL | 0 refills | Status: DC

## 2021-11-01 MED ORDER — LIDOCAINE VISCOUS HCL 2 % MT SOLN
15.0000 mL | Freq: Once | OROMUCOSAL | Status: AC
  Administered 2021-11-01: 15 mL via ORAL

## 2021-11-01 MED ORDER — PANTOPRAZOLE SODIUM 40 MG PO TBEC: 40.0000 mg | DELAYED_RELEASE_TABLET | Freq: Every day | ORAL | 0 refills | Status: DC

## 2021-11-01 MED ORDER — AMLODIPINE BESYLATE 5 MG PO TABS: 5.0000 mg | ORAL_TABLET | Freq: Every day | ORAL | 1 refills | Status: DC

## 2021-11-01 MED ORDER — ALUM & MAG HYDROXIDE-SIMETH 200-200-20 MG/5ML PO SUSP
30.0000 mL | Freq: Once | ORAL | Status: AC
  Administered 2021-11-01: 30 mL via ORAL

## 2021-11-01 MED ORDER — ONDANSETRON 4 MG PO TBDP: 4.0000 mg | ORAL_TABLET | Freq: Three times a day (TID) | ORAL | 0 refills | Status: DC | PRN

## 2021-11-01 MED ORDER — LIDOCAINE VISCOUS HCL 2 % MT SOLN
OROMUCOSAL | Status: AC
  Filled 2021-11-01: qty 15

## 2021-11-01 NOTE — Discharge Instructions (Addendum)
Your blood pressure is very elevated.  I am starting you on amlodipine 5 mg.  You need to follow-up with either our clinic or primary care provider within 1 to 2 weeks.  We will try to establish you with someone via PCP assistance.  Avoid NSAIDs (aspirin, ibuprofen/Advil, naproxen/Aleve), decongestants, caffeine, sodium.  Monitor your blood pressure at home.  If you develop any chest pain, shortness of breath, headache, vision changes, dizziness in the setting of high blood pressure you need to go to the emergency room as we discussed. ? ?I am glad you are feeling better after the medication.  I sent in Zofran to be used up to 3 times a day as needed for nausea.  Make sure you are eating a bland diet (brat diet bananas, rice, applesauce, toast) and drinking plenty of fluid.  Please avoid spicy/acidic/fatty foods.  Take Protonix on empty stomach (30 minutes before meal or 2 hours after).  Start Carafate 3 times daily with meals and then before bed.  I am hoping these will help improve your stomach upset.  Please avoid NSAIDs (aspirin, ibuprofen/Advil, naproxen/Aleve) and alcohol.  If your symptoms persist please call to schedule appointment with a stomach specialist.  If you develop any severe pain, fever, blood in your stool, blood in your vomit, nausea/vomiting interfering with oral intake you need to go to the emergency room as we discussed. ?

## 2021-11-01 NOTE — ED Notes (Signed)
Pt states unable to provide urine sample before discharging.  ?

## 2021-11-01 NOTE — ED Provider Notes (Signed)
?[facility] ? ? ? ?CSN: [id number] ?Arrival date & time: 11/01/21  1226 ? ? ?  ? ?History   ?Chief Complaint ?Chief Complaint  ?Patient presents with  ? Abdominal Pain  ? Fatigue  ? Emesis  ? ? ?HPI ?Bradley Haas is a 56 y.o. male.  ? ?Patient presents today with several week history of intermittent abdominal pain that is worsened in the past day.  He reports that symptoms resulted in 1 episode of emesis which he described as food contents without hematemesis.  He reports abdominal pain is localized to epigastrium and left upper quadrant, described as aching, currently rated 4, no aggravating or alleviating factors identified.  He has not tried any over-the-counter medication for symptom management.  Denies any fever, chest pain, shortness of breath, recent illness, cough, diarrhea, melena, hematochezia.  He denies history of gastritis, H. pylori, peptic ulcer disease.  Does not take NSAIDs on a regular basis.  He does drink alcohol occasionally but denies heavy alcohol use.  He denies any suspicious food intake, recent medication changes, antibiotic use, known sick contacts, suspicious food intake.  He reports he has been able to drink earlier today but has not tried to eat as he continues to have nausea symptoms.  Denies previous abdominal surgery. ? ?Patient's blood pressure was noted to be very elevated today.  He denies any current headache, dizziness, chest pain, shortness of breath, visual disturbance.  He denies recent NSAID use, decongestant use, increased consumption of sodium or caffeine.  He does not have a primary care provider.  He denies previous diagnosis of hypertension and does not take any antihypertensive medications. ? ? ?Past Medical History:  ?Diagnosis Date  ? Facial skin lesion 10/2015  ? nose  ? Lipoma of neck 10/2015  ? left anterior  ? Rash 10/27/2015  ? legs  ? ? ?Patient Active Problem List  ? Diagnosis Date Noted  ? Abscess of face 04/03/2016  ? Changing skin lesion 11/02/2015   ? Dermoid cyst 09/27/2015  ? Elevated blood pressure 09/27/2015  ? Screening for STD (sexually transmitted disease) 09/27/2015  ? Tobacco abuse 09/27/2015  ? ? ?Past Surgical History:  ?Procedure Laterality Date  ? LESION REMOVAL N/A 11/02/2015  ? Procedure: NASAL LESION REMOVAL;  Surgeon: Wallace Going, DO;  Location: [place];  Service: Plastics;  Laterality: N/A;  ? LIPOMA EXCISION N/A 11/02/2015  ? Procedure: EXCISION OF NECK LESION;  Surgeon: Wallace Going, DO;  Location: [place];  Service: Plastics;  Laterality: N/A;  ? NO PAST SURGERIES    ? ? ? ? ? ?Home Medications   ? ?Prior to Admission medications   ?Medication Sig Start Date End Date Taking? Authorizing Provider  ?amLODipine (NORVASC) 5 MG tablet Take 1 tablet (5 mg total) by mouth daily. 11/01/21  Yes Wille Aubuchon, Junie Panning K, PA-C  ?ondansetron (ZOFRAN-ODT) 4 MG disintegrating tablet Take 1 tablet (4 mg total) by mouth every 8 (eight) hours as needed for nausea or vomiting. 11/01/21  Yes Taila Basinski K, PA-C  ?pantoprazole (PROTONIX) 40 MG tablet Take 1 tablet (40 mg total) by mouth daily. 11/01/21  Yes Meital Riehl, Junie Panning K, PA-C  ?sucralfate (CARAFATE) 1 g tablet Take 1 tablet (1 g total) by mouth 4 (four) times daily -  with meals and at bedtime. 11/01/21  Yes Ansley Mangiapane, Derry Skill, PA-C  ? ? ?Family History ?History reviewed. No pertinent family history. ? ?Social History ?Social History  ? ?Tobacco Use  ?  Smoking status: Every Day  ?  Packs/day: 1.00  ?  Years: 20.00  ?  Pack years: 20.00  ?  Types: Cigarettes  ? Smokeless tobacco: Never  ?Substance Use Topics  ? Alcohol use: Yes  ?  Comment: occasionally  ? Drug use: Yes  ?  Frequency: 7.0 times per week  ?  Types: Marijuana  ?  Comment: last used 10/26/2015 - advised to stop 3 days prior to surgery  ? ? ? ?Allergies   ?Patient has no known allergies. ? ? ?Review of Systems ?Review of Systems  ?Constitutional:  Positive for activity change. Negative for appetite change, fatigue  and fever.  ?Eyes:  Negative for visual disturbance.  ?Respiratory:  Negative for cough and shortness of breath.   ?Cardiovascular:  Negative for chest pain.  ?Gastrointestinal:  Positive for abdominal pain, nausea and vomiting. Negative for diarrhea.  ?Neurological:  Negative for dizziness, light-headedness and headaches.  ? ? ?Physical Exam ?Triage Vital Signs ?ED Triage Vitals  ?Enc Vitals Group  ?   BP 11/01/21 1259 (!) 185/90  ?   Pulse Rate 11/01/21 1259 (!) 52  ?   Resp 11/01/21 1259 16  ?   Temp 11/01/21 1259 97.6 ?F (36.4 ?C)  ?   Temp Source 11/01/21 1259 Oral  ?   SpO2 11/01/21 1259 100 %  ?   Weight 11/01/21 1258 149 lb 14.6 oz (68 kg)  ?   Height 11/01/21 1258 '5\' 9"'$  (1.753 m)  ?   Head Circumference --   ?   Peak Flow --   ?   Pain Score 11/01/21 1257 4  ?   Pain Loc --   ?   Pain Edu? --   ?   Excl. in [place]? --   ? ?No data found. ? ?Updated Vital Signs ?BP (!) 185/90 (BP Location: Left Arm)   Pulse (!) 52   Temp 97.6 ?F (36.4 ?C) (Oral)   Resp 16   Ht '5\' 9"'$  (1.753 m)   Wt 149 lb 14.6 oz (68 kg)   SpO2 100%   BMI 22.14 kg/m?  ? ?Visual Acuity ?Right Eye Distance:   ?Left Eye Distance:   ?Bilateral Distance:   ? ?Right Eye Near:   ?Left Eye Near:    ?Bilateral Near:    ? ?Physical Exam ?Vitals reviewed.  ?Constitutional:   ?   General: He is awake.  ?   Appearance: Normal appearance. He is well-developed. He is not ill-appearing.  ?   Comments: Very pleasant male appears stated age in no acute distress sitting comfortably in exam room  ?HENT:  ?   Head: Normocephalic and atraumatic.  ?Cardiovascular:  ?   Rate and Rhythm: Normal rate and regular rhythm.  ?   Heart sounds: Normal heart sounds, S1 normal and S2 normal. No murmur heard. ?Pulmonary:  ?   Effort: Pulmonary effort is normal.  ?   Breath sounds: Normal breath sounds. No stridor. No wheezing, rhonchi or rales.  ?   Comments: Clear to auscultation bilaterally ?Abdominal:  ?   General: Bowel sounds are normal.  ?   Palpations: Abdomen is  soft.  ?   Tenderness: There is abdominal tenderness in the epigastric area and left upper quadrant. There is no right CVA tenderness, left CVA tenderness, guarding or rebound.  ?   Comments: Mild tenderness palpation in upper abdomen.  No evidence of acute abdomen on physical exam.  ?Neurological:  ?   Mental Status:  He is alert.  ?Psychiatric:     ?   Behavior: Behavior is cooperative.  ? ? ? ?UC Treatments / Results  ?Labs ?(all labs ordered are listed, but only abnormal results are displayed) ?Labs Reviewed  ?CBC WITH DIFFERENTIAL/PLATELET  ?COMPREHENSIVE METABOLIC PANEL  ?LIPASE, BLOOD  ?POCT URINALYSIS DIPSTICK, ED / UC  ? ? ?EKG ? ? ?Radiology ?No results found. ? ?Procedures ?Procedures (including critical care time) ? ?Medications Ordered in UC ?Medications  ?ondansetron (ZOFRAN-ODT) disintegrating tablet 4 mg (4 mg Oral Given 11/01/21 1345)  ?alum & mag hydroxide-simeth (MAALOX/MYLANTA) 200-200-20 MG/5ML suspension 30 mL (30 mLs Oral Given 11/01/21 1345)  ?  And  ?lidocaine (XYLOCAINE) 2 % viscous mouth solution 15 mL (15 mLs Oral Given 11/01/21 1345)  ? ? ?Initial Impression / Assessment and Plan / UC Course  ?I have reviewed the triage vital signs and the nursing notes. ? ?Pertinent labs & imaging results that were available during my care of the patient were reviewed by me and considered in my medical decision making (see chart for details). ? ?  ? ?Blood pressure is elevated.  It has been persistently elevated though not to this level for the past 4 to 5 years.  We will start antihypertensive medication the patient was prescribed amlodipine 5 mg with the anticipation of increasing this to 10 mg at next visit.  Recommended follow-up within 1 to 2 weeks with either our clinic or primary care.  He does not have a primary care provider so we will try to establish him with 1 via PCP assistance but can return here for blood pressure recheck in the meantime.  Recommended he avoid NSAIDs, decongestants, sodium,  caffeine.  If he develops any chest pain, shortness of breath, headache, vision changes, dizziness in the setting of high blood pressure he needs to go to the emergency room to which he expressed understanding. ?

## 2021-11-01 NOTE — ED Triage Notes (Signed)
Pt reports abdominal pain, fatigue, and emesis x 1 day. States he vomited last night and none today.  ?

## 2021-11-14 ENCOUNTER — Ambulatory Visit (HOSPITAL_COMMUNITY)
Admission: EM | Admit: 2021-11-14 | Discharge: 2021-11-14 | Disposition: A | Discharge status: Home / Self Care | Payer: BLUE CROSS/BLUE SHIELD | Attending: Physician Assistant | Admitting: Physician Assistant

## 2021-11-14 ENCOUNTER — Encounter (HOSPITAL_COMMUNITY): Payer: Self-pay

## 2021-11-14 DIAGNOSIS — D72829 Elevated white blood cell count, unspecified: Secondary | ICD-10-CM | POA: Insufficient documentation

## 2021-11-14 DIAGNOSIS — I10 Essential (primary) hypertension: Secondary | ICD-10-CM | POA: Insufficient documentation

## 2021-11-14 DIAGNOSIS — E876 Hypokalemia: Secondary | ICD-10-CM | POA: Diagnosis present

## 2021-11-14 DIAGNOSIS — R1010 Upper abdominal pain, unspecified: Secondary | ICD-10-CM | POA: Diagnosis present

## 2021-11-14 LAB — BASIC METABOLIC PANEL
Anion gap: 5 (ref 5–15)
BUN: 15 mg/dL (ref 6–20)
CO2: 29 mmol/L (ref 22–32)
Calcium: 9.9 mg/dL (ref 8.9–10.3)
Chloride: 104 mmol/L (ref 98–111)
Creatinine, Ser: 0.95 mg/dL (ref 0.61–1.24)
GFR, Estimated: >60 mL/min (ref >60)
Glucose, Bld: 68 mg/dL — ABNORMAL LOW (ref 70–99)
Potassium: 4.1 mmol/L (ref 3.5–5.1)
Sodium: 138 mmol/L (ref 135–145)

## 2021-11-14 LAB — CBC WITH DIFFERENTIAL/PLATELET
Abs Immature Granulocytes: 0.02 10*3/uL (ref 0.00–0.07)
Basophils Absolute: 0.1 10*3/uL (ref 0.0–0.1)
Basophils Relative: 1 %
Eosinophils Absolute: 0.7 10*3/uL — ABNORMAL HIGH (ref 0.0–0.5)
Eosinophils Relative: 6 %
HCT: 41.2 % (ref 39.0–52.0)
Hemoglobin: 14 g/dL (ref 13.0–17.0)
Immature Granulocytes: 0 %
Lymphocytes Relative: 31 %
Lymphs Abs: 3.3 10*3/uL (ref 0.7–4.0)
MCH: 31 pg (ref 26.0–34.0)
MCHC: 34 g/dL (ref 30.0–36.0)
MCV: 91.4 fL (ref 80.0–100.0)
Monocytes Absolute: 0.8 10*3/uL (ref 0.1–1.0)
Monocytes Relative: 8 %
Neutro Abs: 5.9 10*3/uL (ref 1.7–7.7)
Neutrophils Relative %: 54 %
Platelets: 353 10*3/uL (ref 150–400)
RBC: 4.51 MIL/uL (ref 4.22–5.81)
RDW: 13.2 % (ref 11.5–15.5)
WBC: 10.7 10*3/uL — ABNORMAL HIGH (ref 4.0–10.5)
nRBC: 0 % (ref 0.0–0.2)

## 2021-11-14 MED ORDER — FAMOTIDINE 40 MG PO TABS: 40.0000 mg | ORAL_TABLET | Freq: Every day | ORAL | 1 refills | Status: DC

## 2021-11-14 MED ORDER — AMLODIPINE BESYLATE 10 MG PO TABS: 10.0000 mg | ORAL_TABLET | Freq: Every day | ORAL | 2 refills | Status: DC

## 2021-11-14 NOTE — ED Provider Notes (Signed)
?[facility] ? ? ? ?CSN: [id number] ?Arrival date & time: 11/14/21  1657 ? ? ?  ? ?History   ?Chief Complaint ?Chief Complaint  ?Patient presents with  ? Follow-up  ? ? ?HPI ?Bradley Haas is a 56 y.o. male.  ? ?Patient presents today for follow-up.  He was seen on 11/01/2021 with upper abdominal pain and GI symptoms.  He was given Carafate and Zofran with improvement of symptoms.  He continues to have intermittent upper abdominal pain but no longer is experiencing nausea or vomiting.  Work-up during that time showed mild leukocytosis and hypokalemia.  Potassium supplement sent to pharmacy which he has since completed.  He has not scheduled an appointment with GI specialist.  He denies any ongoing nausea, vomiting, melena, hematochezia.  Reports he is feeling much better but continues to have intermittent pain. ? ?In addition, patient was noted to have very elevated blood pressure last office visit.  He was started on amlodipine 5 mg.  He has been taking this as prescribed.  His blood pressure is improved but not yet at goal in clinic today.  He is open to blood pressure medication adjustment.  He does not currently have a PCP so we will try to establish him with 1.  He denies any NSAID, caffeine, sodium, decongestant use.  Denies headache, chest pain, shortness of breath, leg swelling, vision change, dizziness. ? ? ?History reviewed. No pertinent past medical history. ? ?There are no problems to display for this patient. ? ? ?History reviewed. No pertinent surgical history. ? ? ? ? ?Home Medications   ? ?Prior to Admission medications   ?Medication Sig Start Date End Date Taking? Authorizing Provider  ?amLODipine (NORVASC) 10 MG tablet Take 1 tablet (10 mg total) by mouth daily. 11/14/21  Yes Edgel Degnan K, PA-C  ?famotidine (PEPCID) 40 MG tablet Take 1 tablet (40 mg total) by mouth at bedtime. 11/14/21  Yes Karolynn Infantino, Derry Skill, PA-C  ? ? ?Family History ?History reviewed. No pertinent family history. ? ?Social  History ?Social History  ? ?Tobacco Use  ? Smoking status: Every Day  ?  Types: Cigarettes  ? Smokeless tobacco: Never  ?Substance Use Topics  ? Alcohol use: Yes  ?  Comment: socially  ? Drug use: Yes  ?  Types: Marijuana  ? ? ? ?Allergies   ?Patient has no known allergies. ? ? ?Review of Systems ?Review of Systems  ?Constitutional:  Positive for activity change. Negative for appetite change, fatigue and fever.  ?Respiratory:  Negative for cough and shortness of breath.   ?Cardiovascular:  Negative for chest pain, palpitations and leg swelling.  ?Gastrointestinal:  Positive for abdominal pain. Negative for blood in stool, constipation, diarrhea, nausea and vomiting.  ?Neurological:  Negative for dizziness, light-headedness and headaches.  ? ? ?Physical Exam ?Triage Vital Signs ?ED Triage Vitals [11/14/21 1849]  ?Enc Vitals Group  ?   BP (!) 142/96  ?   Pulse Rate (!) 53  ?   Resp 16  ?   Temp 98.5 ?F (36.9 ?C)  ?   Temp Source Oral  ?   SpO2 100 %  ?   Weight   ?   Height   ?   Head Circumference   ?   Peak Flow   ?   Pain Score 0  ?   Pain Loc   ?   Pain Edu?   ?   Excl. in [place]?   ? ?No data found. ? ?  Updated Vital Signs ?BP (!) 142/96   Pulse (!) 53   Temp 98.5 ?F (36.9 ?C) (Oral)   Resp 16   SpO2 100%  ? ?Visual Acuity ?Right Eye Distance:   ?Left Eye Distance:   ?Bilateral Distance:   ? ?Right Eye Near:   ?Left Eye Near:    ?Bilateral Near:    ? ?Physical Exam ?Vitals reviewed.  ?Constitutional:   ?   General: He is awake.  ?   Appearance: Normal appearance. He is well-developed. He is not ill-appearing.  ?   Comments: Very pleasant male appears stated age in no acute distress sitting comfortably in exam room  ?HENT:  ?   Head: Normocephalic and atraumatic.  ?   Mouth/Throat:  ?   Dentition: Abnormal dentition.  ?Cardiovascular:  ?   Rate and Rhythm: Normal rate and regular rhythm.  ?   Heart sounds: Normal heart sounds, S1 normal and S2 normal. No murmur heard. ?Pulmonary:  ?   Effort: Pulmonary effort is  normal.  ?   Breath sounds: Normal breath sounds. No stridor. No wheezing, rhonchi or rales.  ?   Comments: Clear to auscultation bilaterally ?Abdominal:  ?   General: Bowel sounds are normal.  ?   Palpations: Abdomen is soft.  ?   Tenderness: There is no abdominal tenderness. There is no right CVA tenderness, left CVA tenderness, guarding or rebound.  ?   Comments: Benign abdominal exam  ?Musculoskeletal:  ?   Right lower leg: No edema.  ?   Left lower leg: No edema.  ?Neurological:  ?   Mental Status: He is alert.  ?Psychiatric:     ?   Behavior: Behavior is cooperative.  ? ? ? ?UC Treatments / Results  ?Labs ?(all labs ordered are listed, but only abnormal results are displayed) ?Labs Reviewed  ?CBC WITH DIFFERENTIAL/PLATELET  ?BASIC METABOLIC PANEL  ? ? ?EKG ? ? ?Radiology ?No results found. ? ?Procedures ?Procedures (including critical care time) ? ?Medications Ordered in UC ?Medications - No data to display ? ?Initial Impression / Assessment and Plan / UC Course  ?I have reviewed the triage vital signs and the nursing notes. ? ?Pertinent labs & imaging results that were available during my care of the patient were reviewed by me and considered in my medical decision making (see chart for details). ? ?  ? ?Vital signs and physical exam reassuring today; no indication for emergent evaluation or imaging.  Given intermittent symptoms recommended he follow-up with GI specialist as he may need endoscopy to rule out peptic ulcer disease or H. pylori causing continued symptoms.  He has had significant improvement of symptoms.  Recommended that he avoid spicy/acidic foods as well as NSAIDs and alcohol.  He was started on famotidine at night.  Discussed that if he develops any severe abdominal pain, nausea, vomiting, melena, medic easier, hematemesis he needs to go to the emergency room.  Strict return precautions given to which he expressed understanding. ? ?Blood pressure significantly improved but remains above goal.   Will increase amlodipine to 10 mg daily.  New prescription sent to pharmacy.  Patient was instructed to avoid NSAIDs, caffeine, sodium, decongestants.  We will try to establish him with PCP via PCP assistance.  Discussed that he should have his blood pressure rechecked in a few weeks and if he cannot see PCP in this timeframe he should return here for recheck.  If he develops any chest pain, shortness of breath, headache, vision change,  dizziness, leg swelling in the setting of high blood pressure he needs to go to the emergency room. ? ?Patient was noted to have mild leukocytosis and hypokalemia at last visit.  Labs repeated today.  We will make recommendations based on laboratory findings. ? ?Final Clinical Impressions(s) / UC Diagnoses  ? ?Final diagnoses:  ?Elevated blood pressure reading in office with diagnosis of hypertension  ?Upper abdominal pain  ?Hypokalemia  ?Leukocytosis, unspecified type  ? ? ? ?Discharge Instructions   ? ?  ?I am glad that you are feeling better.  I do think you should still follow-up with the stomach specialist please call to schedule an appointment.  Take famotidine at night.  Avoid spicy/acidic foods.  I will contact you with your lab work if it is abnormal. ? ?Your blood pressure is much better!  It does have some room for improvement so I would like you to increase your amlodipine to 10 mg daily.  I sent a new prescription to the pharmacy.  You will need to establish with a primary care provider.  Someone should contact you to schedule an appointment.  If you have any worsening symptoms please return for reevaluation.  Someone to recheck your blood pressure within a few weeks and if you are unable to see PCP in this timeframe please return here.  If you develop any headache, chest pain, shortness of breath, leg swelling, dizziness, vision change in the setting of high blood pressure you need to go to the emergency room. ? ? ? ?ED Prescriptions   ? ? Medication Sig Dispense Auth.  Provider  ? amLODipine (NORVASC) 10 MG tablet Take 1 tablet (10 mg total) by mouth daily. 30 tablet Oneda Duffett K, PA-C  ? famotidine (PEPCID) 40 MG tablet Take 1 tablet (40 mg total) by mouth at bedtime. 3

## 2021-11-14 NOTE — Discharge Instructions (Addendum)
I am glad that you are feeling better.  I do think you should still follow-up with the stomach specialist please call to schedule an appointment.  Take famotidine at night.  Avoid spicy/acidic foods.  I will contact you with your lab work if it is abnormal. ? ?Your blood pressure is much better!  It does have some room for improvement so I would like you to increase your amlodipine to 10 mg daily.  I sent a new prescription to the pharmacy.  You will need to establish with a primary care provider.  Someone should contact you to schedule an appointment.  If you have any worsening symptoms please return for reevaluation.  Someone to recheck your blood pressure within a few weeks and if you are unable to see PCP in this timeframe please return here.  If you develop any headache, chest pain, shortness of breath, leg swelling, dizziness, vision change in the setting of high blood pressure you need to go to the emergency room. ?

## 2021-11-14 NOTE — ED Triage Notes (Signed)
Pt presents for a follow up from a previous visit here. Pt states he still has off and on stomach pain ? ?

## 2021-11-19 ENCOUNTER — Encounter (HOSPITAL_COMMUNITY): Payer: Self-pay | Admitting: Emergency Medicine

## 2021-11-19 ENCOUNTER — Encounter: Payer: Self-pay | Admitting: Nurse Practitioner

## 2021-12-05 NOTE — Progress Notes (Signed)
? ? ? ?12/05/2021 ?Blessed Beason ?[id number] ?08/12/1965 ? ? ?CHIEF COMPLAINT: Stomach pain ? ?HISTORY OF PRESENT ILLNESS:  Bradley Haas is a 56 year old male with a past medical history of hypertension, chronic tobacco use and cocaine use (abstinent x 1 year). He presents to our office today as recommended by Verna Czech PA-C for further evaluation regarding epigastric pain.  He endorses having epigastric pain that comes and goes for the past year.  His epigastric pain feels like somebody punched him in the stomach and can last 1 to 2 hours and sometimes radiates towards the LUQ area.  Episodes of epigastric pain occur every 2 to 3 days.  No associated nausea or vomiting.  Eating does not trigger or worsen this pain.  He denies having any dysphagia or heartburn.  No lower abdominal pain.  He is passing normal formed brown bowel movement daily.  No rectal bleeding or black stools.  No weight loss.  Never had a screening colonoscopy ? ?He went to the urgent care clinic 11/01/2021 due to having worsening upper abdominal pain.  At that time, he had 1 episode of nausea and vomiting.  No hematemesis.  His blood pressure was elevated and he was started on Amlodipine 5 mg p.o. daily.  He received Zofran and GI cocktail and his epigastric pain improved.  He was discharged home on Protonix 40 mg daily and Carafate 1 g 4 times daily.  He was seen at urgent care for follow-up 11/14/2021, his blood pressure was improved and his epigastric pain decreased but did not abate for he was encouraged to undergo further GI evaluation. ? ?He complains of having intermittent left chest pain which last for 20 to 30 minutes which occurs when sitting, walking and sometimes is worse when laying flat.  No associated dizziness, palpitations or shortness of breath.  He reported having of intermittent left chest episodes once every 2 weeks for the past 2 years.  He denies ever seeing a cardiologist.  He smokes 1/2 pack of cigarettes daily x42 years.   Abstinent from cocaine x one year. ? ? ?  Latest Ref Rng & Units 11/14/2021  ?  7:06 PM 11/01/2021  ?  1:28 PM 10/21/2013  ?  7:08 PM  ?CBC  ?WBC 4.0 - 10.5 K/uL 10.7   11.2   8.6    ?Hemoglobin 13.0 - 17.0 g/dL 14.0   13.7   13.5    ?Hematocrit 39.0 - 52.0 % 41.2   40.5   38.2    ?Platelets 150 - 400 K/uL 353   309   220    ?  ? ?  Latest Ref Rng & Units 11/14/2021  ?  7:06 PM 11/01/2021  ?  1:28 PM 09/27/2015  ? 11:39 AM  ?CMP  ?Glucose 70 - 99 mg/dL 68   135   76    ?BUN 6 - 20 mg/dL '15   9   12    '$ ?Creatinine 0.61 - 1.24 mg/dL 0.95   1.10   1.03    ?Sodium 135 - 145 mmol/L 138   137   139    ?Potassium 3.5 - 5.1 mmol/L 4.1   2.9   4.0    ?Chloride 98 - 111 mmol/L 104   103   103    ?CO2 22 - 32 mmol/L '29   25   21    '$ ?Calcium 8.9 - 10.3 mg/dL 9.9   9.1   9.5    ?Total Protein  6.5 - 8.1 g/dL  6.6     ?Total Bilirubin 0.3 - 1.2 mg/dL  0.4     ?Alkaline Phos 38 - 126 U/L  56     ?AST 15 - 41 U/L  16     ?ALT 0 - 44 U/L  9     ?  ?Past Medical History:  ?Diagnosis Date  ? Facial skin lesion 10/2015  ? nose  ? Lipoma of neck 10/2015  ? left anterior  ? Rash 10/27/2015  ? legs  ? ?Past Surgical History:  ?Procedure Laterality Date  ? LESION REMOVAL N/A 11/02/2015  ? Procedure: NASAL LESION REMOVAL;  Surgeon: Wallace Going, DO;  Location: [facility];  Service: Plastics;  Laterality: N/A;  ? LIPOMA EXCISION N/A 11/02/2015  ? Procedure: EXCISION OF NECK LESION;  Surgeon: Wallace Going, DO;  Location: [facility];  Service: Plastics;  Laterality: N/A;  ? NO PAST SURGERIES    ? ?Social History: He smokes cigarettes 1/2 ppd 42 years. He drinks liquor or beer 6 beers monthly.  He smokes marijuana daily. Remote cocaine use, abstinent x 1 years.  He smokes marijuana most days. ? ?Family History: Father had prostate cancer. Mother age 60 heart disease, required heart surgery.  ? ?No Known Allergies ? ?  ?Outpatient Encounter Medications as of 12/06/2021  ?Medication Sig  ? amLODipine (NORVASC) 10 MG  tablet Take 1 tablet (10 mg total) by mouth daily.  ? amLODipine (NORVASC) 5 MG tablet Take 1 tablet (5 mg total) by mouth daily.  ? famotidine (PEPCID) 40 MG tablet Take 1 tablet (40 mg total) by mouth at bedtime.  ? ondansetron (ZOFRAN-ODT) 4 MG disintegrating tablet Take 1 tablet (4 mg total) by mouth every 8 (eight) hours as needed for nausea or vomiting.  ? pantoprazole (PROTONIX) 40 MG tablet Take 1 tablet (40 mg total) by mouth daily.  ? potassium chloride (KLOR-CON) 10 MEQ tablet Take 1 tablet (10 mEq total) by mouth 2 (two) times daily for 7 days.  ? sucralfate (CARAFATE) 1 g tablet Take 1 tablet (1 g total) by mouth 4 (four) times daily -  with meals and at bedtime.  ? ?No facility-administered encounter medications on file as of 12/06/2021.  ? ? ?REVIEW OF SYSTEMS:  ?Gen: Denies fever, sweats or chills. No weight loss.  ?CV: See HPI.   ?Resp: Denies cough, shortness of breath of hemoptysis.  ?GI: See HPI.   ?GU : Denies urinary burning, blood in urine, increased urinary frequency or incontinence. ?MS: Denies joint pain, muscles aches or weakness. ?Derm: Denies rash, itchiness, skin lesions or unhealing ulcers. ?Psych: Denies depression, anxiety or memory loss ?Heme: Denies bruising, easy bleeding. ?Neuro:  Denies headaches, dizziness or paresthesias. ?Endo:  Denies any problems with DM, thyroid or adrenal function. ? ?PHYSICAL EXAM: ?BP 140/80   Pulse 74   Ht '5\' 9"'$  (1.753 m)   Wt 145 lb (65.8 kg)   SpO2 98%   BMI 21.41 kg/m?  ? ?General: Soft-spoken 56 year old male in no acute distress. ?Head: Normocephalic and atraumatic. ?Eyes:  Sclerae non-icteric, conjunctive pink. ?Ears: Normal auditory acuity. ?Mouth: Poor dentition.  No ulcers or lesions.  ?Neck: Supple, no lymphadenopathy or thyromegaly.  ?Lungs: Clear bilaterally to auscultation without wheezes, crackles or rhonchi. ?Heart: Regular rate and rhythm. No murmur, rub or gallop appreciated.  ?Abdomen: Soft, non distended.  Mild epigastric  tenderness without rebound or guarding.  No masses. No hepatosplenomegaly. Normoactive bowel sounds x  4 quadrants.  ?Rectal: Deferred. ?Musculoskeletal: Symmetrical with no gross deformities. ?Skin: Warm and dry. No rash or lesions on visible extremities. ?Extremities: No edema. ?Neurological: Alert oriented x 4, no focal deficits.  ?Psychological:  Alert and cooperative. Normal mood and affect. ? ?ASSESSMENT AND PLAN: ? ?58) 56 year old male with intermittent epigastric pain x 1 year.  ?-EGD benefits and risks discussed including risk with sedation, risk of bleeding, perforation and infection. EGD to be scheduled after cardiac clearance received ?-Continue pantoprazole 40 mg daily ?-GERD diet handout  ?-Patient to contact her office if his symptoms worsen ? ?2) Colon cancer screening ?-Colonoscopy benefits and risks discussed including risk with sedation, risk of bleeding, perforation and infection.  Colonoscopy to be scheduled after cardiac clearance received. ? ?3) Left chest pain which occurs once every 2 weeks and last for approximately 20 minutes for the past year, occurs during rest and with activity. ?-Cardiology referral to evaluate left chest pain and to provide cardiac clearance prior to scheduling EGD and colonoscopy ?-Patient was instructed to go to the emergency room if chest pain recurs ? ? ? ? ? ? ? ? ? ? ?CC:  No ref. provider found ? ? ? ?

## 2021-12-06 ENCOUNTER — Encounter: Payer: Self-pay | Admitting: Nurse Practitioner

## 2021-12-06 ENCOUNTER — Ambulatory Visit (INDEPENDENT_AMBULATORY_CARE_PROVIDER_SITE_OTHER): Payer: Self-pay | Admitting: Nurse Practitioner

## 2021-12-06 VITALS — BP 140/80 | HR 74 | Ht 69.0 in | Wt 145.0 lb

## 2021-12-06 DIAGNOSIS — Z1211 Encounter for screening for malignant neoplasm of colon: Secondary | ICD-10-CM

## 2021-12-06 DIAGNOSIS — R079 Chest pain, unspecified: Secondary | ICD-10-CM

## 2021-12-06 DIAGNOSIS — R1013 Epigastric pain: Secondary | ICD-10-CM

## 2021-12-06 NOTE — Progress Notes (Signed)
Addendum: Reviewed and agree with assessment and management plan. Lindsi Bayliss M, MD  

## 2021-12-06 NOTE — Patient Instructions (Addendum)
We place placed a referral to Ambulatory referral to Cardiology Godfrey Pick Tobb, DO) ? ?Where: [facility] ?Address: [address], [place] [address] ?Phone: [phone].  ?You should hear from their office in the next couple weeks to schedule an appointment. If you do not hear from them please call them at [phone]. ? ?RECOMMENDATIONS: ? ?Continue Pantoprazole 40 MG once a day. ?Go to the Emergency room if your chest pain recurs ?Please contact our office if your symptoms worsen. ? ? ?Thank you for trusting me with your gastrointestinal care!   ? ?Noralyn Pick, CRNP ? ? ? ?BMI: ? ?If you are age 33 or older, your body mass index should be between 23-30. Your Body mass index is 21.41 kg/m?Marland Kitchen If this is out of the aforementioned range listed, please consider follow up with your Primary Care Provider. ? ?If you are age 16 or younger, your body mass index should be between 19-25. Your Body mass index is 21.41 kg/m?Marland Kitchen If this is out of the aformentioned range listed, please consider follow up with your Primary Care Provider.  ? ?MY CHART: ? ?The [facility] providers would like to encourage you to use [facility] to communicate with providers for non-urgent requests or questions.  Due to long hold times on the telephone, sending your provider a message by [facility] may be a faster and more efficient way to get a response.  Please allow 48 business hours for a response.  Please remember that this is for non-urgent requests.  ? ? ?

## 2021-12-11 ENCOUNTER — Ambulatory Visit (INDEPENDENT_AMBULATORY_CARE_PROVIDER_SITE_OTHER): Payer: BLUE CROSS/BLUE SHIELD | Admitting: Internal Medicine

## 2021-12-11 ENCOUNTER — Encounter: Payer: Self-pay | Admitting: Internal Medicine

## 2021-12-11 ENCOUNTER — Ambulatory Visit (HOSPITAL_COMMUNITY): Payer: BLUE CROSS/BLUE SHIELD | Attending: Family Medicine

## 2021-12-11 VITALS — BP 133/91 | HR 47 | Temp 98.2°F | Ht 69.0 in | Wt 143.5 lb

## 2021-12-11 DIAGNOSIS — K219 Gastro-esophageal reflux disease without esophagitis: Secondary | ICD-10-CM

## 2021-12-11 DIAGNOSIS — I208 Other forms of angina pectoris: Secondary | ICD-10-CM

## 2021-12-11 DIAGNOSIS — I209 Angina pectoris, unspecified: Secondary | ICD-10-CM | POA: Diagnosis present

## 2021-12-11 DIAGNOSIS — I10 Essential (primary) hypertension: Secondary | ICD-10-CM

## 2021-12-11 DIAGNOSIS — Z720 Tobacco use: Secondary | ICD-10-CM | POA: Diagnosis not present

## 2021-12-11 DIAGNOSIS — Z0000 Encounter for general adult medical examination without abnormal findings: Secondary | ICD-10-CM

## 2021-12-11 MED ORDER — VARENICLINE TARTRATE 0.5 MG PO TABS: 0.5000 mg | ORAL_TABLET | Freq: Two times a day (BID) | ORAL | 0 refills | Status: DC

## 2021-12-11 MED ORDER — FAMOTIDINE 20 MG PO TABS: 20.0000 mg | ORAL_TABLET | Freq: Two times a day (BID) | ORAL | 0 refills | Status: AC

## 2021-12-11 MED ORDER — LOSARTAN POTASSIUM 50 MG PO TABS: 50.0000 mg | ORAL_TABLET | Freq: Every day | ORAL | 0 refills | Status: DC

## 2021-12-11 MED ORDER — NITROGLYCERIN 0.4 MG SL SUBL: 0.4000 mg | SUBLINGUAL_TABLET | SUBLINGUAL | 2 refills | Status: AC | PRN

## 2021-12-11 MED ORDER — ASPIRIN EC 81 MG PO TBEC: 81.0000 mg | DELAYED_RELEASE_TABLET | Freq: Every day | ORAL | 0 refills | Status: AC

## 2021-12-11 MED ORDER — AMLODIPINE BESYLATE 10 MG PO TABS: 10.0000 mg | ORAL_TABLET | Freq: Every day | ORAL | 0 refills | Status: DC

## 2021-12-11 NOTE — Assessment & Plan Note (Addendum)
Patient endorsed for the past several months chest pain on exertion.  Stating that after extraneous work he feels a squeezing-like sensation on the left side of his chest with associated diaphoresis, lightheadedness/dizziness and mild shortness of breath.  Which is relieved with rest.  Patient has no history of underlying CVD apart from hypertension.  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  Patient is a chronic smoker and last lipid profile on file was in 2017 was normal.  ASCVD score 20%.  Given age and risk factors patient will need further work-up.  No A1c or TSH or recent lipid panel on file, will need to obtain this blood work to assess for any additional risk factors or underlying causes contributing to his current symptoms. Patient has upcoming appointment with cardiology 12/13/2021. ? ?P: ?-Echocardiogram ordered ?-TSH pending ?-A1c pending ?-Lipid panel pending ?-Started on aspirin ASA 81 mg ?-Started on nitroglycerin 0.4 sublingual every 5 minutes as needed ?

## 2021-12-11 NOTE — Assessment & Plan Note (Addendum)
Patient has been smoking cigarettes since age 56.  He smokes about 1 pack per 2 days.  He has not tried any smoke cessation management in the past.  We spoke extensively about the risk and consequences of smoking.  He is interested in quitting.  ? ?P: ?-Started on Chantix 0.5 mg  ?

## 2021-12-11 NOTE — Assessment & Plan Note (Addendum)
Home medications include: amlodipine '10mg'$  daily.  Blood pressure was elevated during today's visit, 146/91--> 133/91 on repeat check.  Blood pressure not at goal.  Patient will need a second agent to control blood pressure. Most recent BMP obtained April 2023, creatinine within normal limits no electrolyte derangement. ? ? ?P: ?-Amlodipine 10 mg daily ?-Started on losartan 50 mg daily ?-Follow-up in 2 weeks ?

## 2021-12-11 NOTE — Patient Instructions (Signed)
Thank you, Mr.Bradley Haas for allowing [place] to provide your care today. Today we discussed blood pressure, chest pain and smoke cessation.   ? ?I have ordered the following labs for you: ? ?Lab Orders    ?     Lipid Profile    ?     Hepatitis C Antibody    ?     Hemoglobin A1c    ?     TSH     ? ?Tests ordered today: ? ?Echocardiogram ? ?Referrals ordered today:  ? ?Referral Orders  ?No referral(s) requested today  ?  ? ?I have ordered the following medication/changed the following medications:  ? ?Stop the following medications: ?Medications Discontinued During This Encounter  ?Medication Reason  ? famotidine (PEPCID) 20 MG tablet Reorder  ? amLODipine (NORVASC) 10 MG tablet Reorder  ?  ? ?Start the following medications: ?Meds ordered this encounter  ?Medications  ? famotidine (PEPCID) 20 MG tablet  ?  Sig: Take 1 tablet (20 mg total) by mouth 2 (two) times daily.  ?  Dispense:  180 tablet  ?  Refill:  0  ? amLODipine (NORVASC) 10 MG tablet  ?  Sig: Take 1 tablet (10 mg total) by mouth daily.  ?  Dispense:  90 tablet  ?  Refill:  0  ? aspirin EC 81 MG tablet  ?  Sig: Take 1 tablet (81 mg total) by mouth daily. Swallow whole.  ?  Dispense:  90 tablet  ?  Refill:  0  ? losartan (COZAAR) 50 MG tablet  ?  Sig: Take 1 tablet (50 mg total) by mouth daily.  ?  Dispense:  90 tablet  ?  Refill:  0  ? varenicline (CHANTIX) 0.5 MG tablet  ?  Sig: Take 1 tablet (0.5 mg total) by mouth 2 (two) times daily.  ?  Dispense:  180 tablet  ?  Refill:  0  ?  ? ?Follow up: 2 weeks ? ?Remember: Continue taking all your medications as directed ? ?Should you have any questions or concerns please call the internal medicine clinic at [phone].   ? ?Timothy Lasso, MD ?[place] ? ? ?

## 2021-12-11 NOTE — Progress Notes (Addendum)
? ?Established Patient Office Visit ? ?Subjective   ?Patient ID: Bradley Haas, male    DOB: 1965-09-18  Age: 56 y.o. MRN: [id number] ? ? ?CC :chest pain on exertion ? ? ?Patient presents to establish care.  He endorsed for the last several months he has been experiencing chest pain on exertion.  With extraneous activities such as working he would experience a squeezing like sensation in the left side of his chest with associated diaphoresis, shortness of breath, lightheaded/dizziness; which is relieved with rest.  He denies history of MI or stroke.  He denies any N/V, abdominal pain, constipation or diarrhea.  Only medication he takes is amlodipine for hypertension.  He states he is compliant with this medication.  He is a chronic smoker, he smokes about 1 pack per 2 days.  He also mentions that he is interested in quitting.  Otherwise, he has an upcoming appointment with cardiology.  He also notes that he is scheduled for a colonoscopy. ? ? ? ? ?Review of Systems  ?Constitutional:  Negative for chills, fever, malaise/fatigue and weight loss.  ?Eyes:  Negative for blurred vision.  ?Respiratory:  Negative for shortness of breath.   ?Cardiovascular:  Negative for chest pain and leg swelling.  ?Gastrointestinal:  Negative for abdominal pain, constipation, diarrhea, nausea and vomiting.  ?Neurological:  Negative for dizziness, weakness and headaches.  ? ?  ?Objective:  ?  ? ?BP (!) 133/91 (BP Location: Right Arm, Cuff Size: Normal)   Pulse (!) 47   Temp 98.2 ?F (36.8 ?C) (Oral)   Ht '5\' 9"'$  (1.753 m)   Wt 143 lb 8 oz (65.1 kg)   SpO2 100%   BMI 21.19 kg/m?  ? ? ?Physical Exam ?Constitutional:   ?   General: He is not in acute distress. ?HENT:  ?   Head: Normocephalic and atraumatic.  ?Cardiovascular:  ?   Rate and Rhythm: Bradycardia present.  ?   Heart sounds: Normal heart sounds.  ?Pulmonary:  ?   Effort: Pulmonary effort is normal.  ?   Breath sounds: Normal breath sounds and air entry.  ?Abdominal:  ?   Tenderness:  There is abdominal tenderness in the epigastric area.  ?   Comments: Mild epigastric tenderness on palpation.  ?Musculoskeletal:  ?   Right lower leg: No edema.  ?   Left lower leg: No edema.  ?Skin: ?   General: Skin is warm and dry.  ?Neurological:  ?   General: No focal deficit present.  ?   Mental Status: He is alert.  ?Psychiatric:     ?   Behavior: Behavior normal. Behavior is cooperative.     ?   Cognition and Memory: Cognition normal.  ? ? ? ?No results found for any visits on 12/11/21. ? ? ?The ASCVD Risk score (Arnett DK, et al., 2019) failed to calculate for the following reasons: ?  Cannot find a previous HDL lab ?  Cannot find a previous total cholesterol lab ? ?Past Medical History:  ?Diagnosis Date  ? Facial skin lesion 10/04/2015  ? nose  ? Hypertension   ? Lipoma of neck 10/04/2015  ? left anterior  ? Rash 10/27/2015  ? legs  ?  ?Family History  ?Problem Relation Age of Onset  ? Prostate cancer Father   ? ?Social Determinants of Health with Concerns  ? ?Tobacco Use: High Risk  ? Smoking Tobacco Use: Every Day  ? Smokeless Tobacco Use: Never  ? Passive Exposure: Not on file  ?  Financial Resource Strain: Not on file  ?Food Insecurity: Not on file  ?Transportation Needs: Not on file  ?Physical Activity: Not on file  ?Stress: Not on file  ?Social Connections: Not on file  ?Intimate Partner Violence: Not on file  ?Depression (PHQ2-9): Medium Risk  ? PHQ-2 Score: 5  ?Alcohol Screen: Not on file  ?Housing: Not on file  ? ? ?  ? ? ?Assessment & Plan:  ? ?Problem List Items Addressed This Visit   ? ?  ? Cardiovascular and Mediastinum  ? Hypertension  ?  Home medications include: amlodipine '10mg'$  daily.  Blood pressure was elevated during today's visit, 146/91--> 133/91 on repeat check.  Blood pressure not at goal.  Patient will need a second agent to control blood pressure. Most recent BMP obtained April 2023, creatinine within normal limits no electrolyte derangement. ? ? ?P: ?-Amlodipine 10 mg  daily ?-Started on losartan 50 mg daily ?-Follow-up in 2 weeks ? ?  ?  ? Relevant Medications  ? amLODipine (NORVASC) 10 MG tablet  ? aspirin EC 81 MG tablet  ? losartan (COZAAR) 50 MG tablet  ? nitroGLYCERIN (NITROSTAT) 0.4 MG SL tablet  ? Stable angina (HCC)  ?  Patient endorsed for the past several months chest pain on exertion.  Stating that after extraneous work he feels a squeezing-like sensation on the left side of his chest with associated diaphoresis, lightheadedness/dizziness and mild shortness of breath.  Which is relieved with rest.  Patient has no history of underlying CVD apart from hypertension.  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  Patient is a chronic smoker and last lipid profile on file was in 2017 was normal.  ASCVD score 20%.  Given age and risk factors patient will need further work-up.  No A1c or TSH or recent lipid panel on file, will need to obtain this blood work to assess for any additional risk factors or underlying causes contributing to his current symptoms. Patient has upcoming appointment with cardiology 12/13/2021. ? ?P: ?-Echocardiogram ordered ?-TSH pending ?-A1c pending ?-Lipid panel pending ?-Started on aspirin ASA 81 mg ?-Started on nitroglycerin 0.4 sublingual every 5 minutes as needed ? ?  ?  ? Relevant Medications  ? amLODipine (NORVASC) 10 MG tablet  ? aspirin EC 81 MG tablet  ? losartan (COZAAR) 50 MG tablet  ? nitroGLYCERIN (NITROSTAT) 0.4 MG SL tablet  ?  ? Digestive  ? GERD (gastroesophageal reflux disease)  ? Relevant Medications  ? famotidine (PEPCID) 20 MG tablet  ?  ? Other  ? Tobacco abuse  ?  Patient has been smoking cigarettes since age 52.  He smokes about 1 pack per 2 days.  He has not tried any smoke cessation management in the past.  We spoke extensively about the risk and consequences of smoking.  He is interested in quitting.  ? ?P: ?-Started on Chantix 0.5 mg  ? ?  ?  ? ?Other Visit Diagnoses    ? ? 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 Relevant Medications  ? amLODipine (NORVASC) 10 MG tablet  ? aspirin EC 81 MG tablet  ? losartan (COZAAR) 50 MG tablet  ? nitroGLYCERIN (NITROSTAT) 0.4 MG SL tablet  ? Other Relevant Orders  ? Lipid Profile  ? EKG 12-Lead (Completed)  ? ECHOCARDIOGRAM COMPLETE  ? Hemoglobin A1c  ? TSH  ? Healthcare maintenance      ? Relevant Orders  ?  Hepatitis C Antibody  ? ?  ? ? ?Return in about 2 weeks (around 12/25/2021).  ? ? ?Timothy Lasso, MD ? ?

## 2021-12-12 ENCOUNTER — Other Ambulatory Visit: Payer: Self-pay | Admitting: Internal Medicine

## 2021-12-12 ENCOUNTER — Ambulatory Visit (HOSPITAL_COMMUNITY)
Admission: RE | Admit: 2021-12-12 | Discharge: 2021-12-12 | Disposition: A | Discharge status: Home / Self Care | Payer: BLUE CROSS/BLUE SHIELD | Source: Ambulatory Visit | Attending: Internal Medicine | Admitting: Internal Medicine

## 2021-12-12 LAB — LIPID PANEL
Chol/HDL Ratio: 4.1 ratio (ref 0.0–5.0)
Cholesterol, Total: 174 mg/dL (ref 100–199)
HDL: 42 mg/dL (ref >39)
LDL Chol Calc (NIH): 123 mg/dL — ABNORMAL HIGH (ref 0–99)
Triglycerides: 46 mg/dL (ref 0–149)
VLDL Cholesterol Cal: 9 mg/dL (ref 5–40)

## 2021-12-12 LAB — HEPATITIS C ANTIBODY: Hep C Virus Ab: NONREACTIVE

## 2021-12-12 LAB — HEMOGLOBIN A1C
Est. average glucose Bld gHb Est-mCnc: 114 mg/dL
Hgb A1c MFr Bld: 5.6 % (ref 4.8–5.6)

## 2021-12-12 LAB — TSH: TSH: 1.22 u[IU]/mL (ref 0.450–4.500)

## 2021-12-12 MED ORDER — ATORVASTATIN CALCIUM 40 MG PO TABS: 40.0000 mg | ORAL_TABLET | Freq: Every day | ORAL | 0 refills | Status: AC

## 2021-12-12 NOTE — Progress Notes (Signed)
Internal Medicine Clinic Attending ? ?Case discussed with Dr. Jeanice Lim  At the time of the visit.  We reviewed the resident?s history and exam and pertinent patient test results.  I agree with the assessment, diagnosis, and plan of care documented in the resident?s note.  ? ?New patient experiencing stable angina with numerous modifiable risk factors for ASCVD. He is currently chest pain free, his EKG has inferior lead Q waves and non specific ST changes, likely representing LVH and maybe prior infarction. Will order echo to eval for structural heart disease. We will start risk factor modification with smoking cessation therapy, aspirin, atorvastatin, and add Losartan for HTN control. Cannot start beta blocker due to bradycardia. He has a new consultation with cardiology planned. If there is low EF, or symptoms are not controlled with medical therapy, may benefit from further ischemic evaluation. ?

## 2021-12-13 ENCOUNTER — Ambulatory Visit (HOSPITAL_COMMUNITY): Admission: RE | Admit: 2021-12-13 | Payer: BLUE CROSS/BLUE SHIELD | Source: Ambulatory Visit

## 2021-12-13 ENCOUNTER — Ambulatory Visit: Payer: BLUE CROSS/BLUE SHIELD | Admitting: Cardiology

## 2021-12-17 ENCOUNTER — Other Ambulatory Visit (HOSPITAL_COMMUNITY): Payer: Self-pay

## 2021-12-17 ENCOUNTER — Ambulatory Visit (HOSPITAL_COMMUNITY)
Admission: EM | Admit: 2021-12-17 | Discharge: 2021-12-17 | Disposition: A | Discharge status: Home / Self Care | Payer: BLUE CROSS/BLUE SHIELD | Attending: Physician Assistant | Admitting: Physician Assistant

## 2021-12-17 ENCOUNTER — Other Ambulatory Visit: Payer: Self-pay

## 2021-12-17 ENCOUNTER — Encounter (HOSPITAL_COMMUNITY): Payer: Self-pay | Admitting: Emergency Medicine

## 2021-12-17 DIAGNOSIS — I10 Essential (primary) hypertension: Secondary | ICD-10-CM | POA: Diagnosis not present

## 2021-12-17 DIAGNOSIS — Z760 Encounter for issue of repeat prescription: Secondary | ICD-10-CM

## 2021-12-17 MED ORDER — AMLODIPINE BESYLATE 10 MG PO TABS
10.0000 mg | ORAL_TABLET | Freq: Every day | ORAL | 1 refills | Status: DC
  Filled 2021-12-17 (×2): qty 30, 30d supply, fill #0

## 2021-12-17 MED ORDER — LOSARTAN POTASSIUM 50 MG PO TABS
50.0000 mg | ORAL_TABLET | Freq: Every day | ORAL | 1 refills | Status: DC
  Filled 2021-12-17 (×3): qty 30, 30d supply, fill #0

## 2021-12-17 NOTE — ED Provider Notes (Signed)
?[facility] ? ? ? ?CSN: [id number] ?Arrival date & time: 12/17/21  1411 ? ? ?  ? ?History   ?Chief Complaint ?Chief Complaint  ?Patient presents with  ? Follow-up  ? ? ?HPI ?Bradley Haas is a 56 y.o. male.  ? ?Patient presents today for blood pressure recheck.  He was last seen 11/14/2021 at which point amlodipine was increased to 10 mg.  He had been taking medication as prescribed at has been without this medication for several days as he ran out of this and could not afford to get it from the pharmacy.  He was seen by his PCP who added losartan as well but he has not yet started this medication.  He denies any chest pain, shortness of breath, headache, vision change.  He is scheduled to see cardiology this week and is anxious to get his blood pressure down before this appointment.  He is avoiding NSAIDs, caffeine, sodium, decongestants.  Reports he is feeling well but has missed work due to medical appointments and is requesting excuse note. ? ? ?Past Medical History:  ?Diagnosis Date  ? Facial skin lesion 10/04/2015  ? nose  ? Hypertension   ? Lipoma of neck 10/04/2015  ? left anterior  ? Rash 10/27/2015  ? legs  ? ? ?Patient Active Problem List  ? Diagnosis Date Noted  ? GERD (gastroesophageal reflux disease) 12/11/2021  ? Hypertension   ? Stable angina (HCC)   ? Abscess of face 04/03/2016  ? Changing skin lesion 11/02/2015  ? Dermoid cyst 09/27/2015  ? Elevated blood pressure 09/27/2015  ? Screening for STD (sexually transmitted disease) 09/27/2015  ? Tobacco abuse 09/27/2015  ? ? ?Past Surgical History:  ?Procedure Laterality Date  ? LESION REMOVAL N/A 11/02/2015  ? Procedure: NASAL LESION REMOVAL;  Surgeon: Wallace Going, DO;  Location: [facility];  Service: Plastics;  Laterality: N/A;  ? LIPOMA EXCISION N/A 11/02/2015  ? Procedure: EXCISION OF NECK LESION;  Surgeon: Wallace Going, DO;  Location: [facility];  Service: Plastics;  Laterality: N/A;  ? NO PAST  SURGERIES    ? ? ? ? ? ?Home Medications   ? ?Prior to Admission medications   ?Medication Sig Start Date End Date Taking? Authorizing Provider  ?amLODipine (NORVASC) 10 MG tablet Take 1 tablet (10 mg total) by mouth daily. 12/17/21   Kimberle Stanfill, Derry Skill, PA-C  ?aspirin EC 81 MG tablet Take 1 tablet (81 mg total) by mouth daily. Swallow whole. 12/11/21 03/11/22  Timothy Lasso, MD  ?atorvastatin (LIPITOR) 40 MG tablet Take 1 tablet (40 mg total) by mouth daily. 12/12/21 03/12/22  Timothy Lasso, MD  ?famotidine (PEPCID) 20 MG tablet Take 1 tablet (20 mg total) by mouth 2 (two) times daily. 12/11/21 03/11/22  Timothy Lasso, MD  ?losartan (COZAAR) 50 MG tablet Take 1 tablet (50 mg total) by mouth daily. 12/17/21   Ryelee Albee, Derry Skill, PA-C  ?nitroGLYCERIN (NITROSTAT) 0.4 MG SL tablet Place 1 tablet (0.4 mg total) under the tongue every 5 (five) minutes as needed for chest pain (Only take when you are having active chest pain). 12/11/21 03/11/22  Timothy Lasso, MD  ?varenicline (CHANTIX) 0.5 MG tablet Take 1 tablet (0.5 mg total) by mouth 2 (two) times daily. ?Patient not taking: Reported on 12/17/2021 12/11/21 03/11/22  Timothy Lasso, MD  ? ? ?Family History ?Family History  ?Problem Relation Age of Onset  ? Prostate cancer Father   ? ? ?Social History ?Social History  ? ?  Tobacco Use  ? Smoking status: Every Day  ?  Packs/day: 1.00  ?  Years: 20.00  ?  Pack years: 20.00  ?  Types: Cigarettes  ? Smokeless tobacco: Never  ?Vaping Use  ? Vaping Use: Never used  ?Substance Use Topics  ? Alcohol use: Yes  ?  Comment: socially  ? Drug use: Not Currently  ?  Frequency: 7.0 times per week  ?  Types: Marijuana  ?  Comment: last used 10/26/2015 - advised to stop 3 days prior to surgery  ? ? ? ?Allergies   ?Patient has no known allergies. ? ? ?Review of Systems ?Review of Systems  ?Constitutional:  Negative for activity change, appetite change, fatigue and fever.  ?Eyes:  Negative for photophobia and visual disturbance.  ?Respiratory:  Negative for  cough and shortness of breath.   ?Cardiovascular:  Negative for chest pain, palpitations and leg swelling.  ?Neurological:  Negative for dizziness, weakness, light-headedness and headaches.  ? ? ?Physical Exam ?Triage Vital Signs ?ED Triage Vitals  ?Enc Vitals Group  ?   BP 12/17/21 1516 (!) 151/89  ?   Pulse Rate 12/17/21 1516 (!) 52  ?   Resp 12/17/21 1516 20  ?   Temp 12/17/21 1516 99.1 ?F (37.3 ?C)  ?   Temp Source 12/17/21 1516 Oral  ?   SpO2 12/17/21 1516 97 %  ?   Weight --   ?   Height --   ?   Head Circumference --   ?   Peak Flow --   ?   Pain Score 12/17/21 1511 0  ?   Pain Loc --   ?   Pain Edu? --   ?   Excl. in [place]? --   ? ?No data found. ? ?Updated Vital Signs ?BP (!) 151/89 (BP Location: Right Arm)   Pulse (!) 52   Temp 99.1 ?F (37.3 ?C) (Oral)   Resp 20   SpO2 97%  ? ?Visual Acuity ?Right Eye Distance:   ?Left Eye Distance:   ?Bilateral Distance:   ? ?Right Eye Near:   ?Left Eye Near:    ?Bilateral Near:    ? ?Physical Exam ?Vitals reviewed.  ?Constitutional:   ?   General: He is awake.  ?   Appearance: Normal appearance. He is well-developed. He is not ill-appearing.  ?   Comments: Very pleasant male appears stated age in no acute distress sitting comfortably in exam room  ?HENT:  ?   Head: Normocephalic and atraumatic.  ?Cardiovascular:  ?   Rate and Rhythm: Normal rate and regular rhythm.  ?   Heart sounds: Normal heart sounds, S1 normal and S2 normal. No murmur heard. ?Pulmonary:  ?   Effort: Pulmonary effort is normal.  ?   Breath sounds: Normal breath sounds. No stridor. No wheezing, rhonchi or rales.  ?   Comments: Clear to auscultation bilaterally ?Musculoskeletal:  ?   Right lower leg: No edema.  ?   Left lower leg: No edema.  ?Neurological:  ?   Mental Status: He is alert.  ?Psychiatric:     ?   Behavior: Behavior is cooperative.  ? ? ? ?UC Treatments / Results  ?Labs ?(all labs ordered are listed, but only abnormal results are displayed) ?Labs Reviewed - No data to  display ? ?EKG ? ? ?Radiology ?No results found. ? ?Procedures ?Procedures (including critical care time) ? ?Medications Ordered in UC ?Medications - No data to display ? ?Initial Impression /  Assessment and Plan / UC Course  ?I have reviewed the triage vital signs and the nursing notes. ? ?Pertinent labs & imaging results that were available during my care of the patient were reviewed by me and considered in my medical decision making (see chart for details). ? ?  ? ?Blood pressure is elevated today but patient reports this is because he has not had his medicine for several days since he could not afford it.  That with good Rx coupon that should be $9 per prescription and believes that he can afford it.  I did call the [facility] who said that 30 days of each medication would only be $5 so a refill was sent to this pharmacy in hopes that this would be more affordable for patient.  He is to go pick it up immediately and start taking it as previously prescribed.  Discussed that he should avoid NSAIDs, aspirin, ibuprofen/Advil.  He is to avoid caffeine, sodium, decongestants.  He is to follow-up with his primary care and cardiology as scheduled.  Discussed that if he develops any chest pain, shortness of breath, headache, vision change in the setting of high blood pressure he is to go to the emergency room immediately to which he expressed understanding.  Work excuse note provided. ? ?Final Clinical Impressions(s) / UC Diagnoses  ? ?Final diagnoses:  ?Elevated blood pressure reading with diagnosis of hypertension  ?Medication refill  ? ? ? ?Discharge Instructions   ? ?  ?I contacted the [facility] and they charge $5 per prescription for 30 days.  Please go pick these up immediately (they are open till 6 PM).  Make sure to follow-up with cardiology as we discussed.  Avoid NSAIDs including aspirin, ibuprofen/Advil, naproxen/Aleve.  Avoid decongestants, caffeine, sodium.  If you develop any chest pain,  shortness of breath, headache, vision change in the setting of high blood pressure you need to go to the emergency room as we discussed. ? ? ? ? ?ED Prescriptions   ? ? Medication Sig Dispense Auth. Provider  ? amLODipine (NORVA

## 2021-12-17 NOTE — ED Triage Notes (Signed)
Patient is back for reevaluation.  Patient says he thinks he was told to come back.  Patient has an appt with cardiologist on Wednesday.  Patient states he feels slightly better today.   ? ?Patient mentions staying out of work due to not feeling well ?

## 2021-12-17 NOTE — Discharge Instructions (Addendum)
I contacted the [facility] and they charge $5 per prescription for 30 days.  Please go pick these up immediately (they are open till 6 PM).  Make sure to follow-up with cardiology as we discussed.  Avoid NSAIDs including aspirin, ibuprofen/Advil, naproxen/Aleve.  Avoid decongestants, caffeine, sodium.  If you develop any chest pain, shortness of breath, headache, vision change in the setting of high blood pressure you need to go to the emergency room as we discussed. ?

## 2021-12-19 ENCOUNTER — Ambulatory Visit: Payer: BLUE CROSS/BLUE SHIELD | Admitting: Cardiology

## 2022-01-02 ENCOUNTER — Ambulatory Visit: Payer: BLUE CROSS/BLUE SHIELD | Admitting: Cardiology

## 2022-01-11 ENCOUNTER — Other Ambulatory Visit (HOSPITAL_COMMUNITY): Payer: Self-pay

## 2022-01-11 ENCOUNTER — Ambulatory Visit (HOSPITAL_COMMUNITY)
Admission: EM | Admit: 2022-01-11 | Discharge: 2022-01-11 | Disposition: A | Discharge status: Home / Self Care | Payer: BLUE CROSS/BLUE SHIELD | Attending: Emergency Medicine | Admitting: Emergency Medicine

## 2022-01-11 ENCOUNTER — Encounter (HOSPITAL_COMMUNITY): Payer: Self-pay | Admitting: Emergency Medicine

## 2022-01-11 DIAGNOSIS — I208 Other forms of angina pectoris: Secondary | ICD-10-CM

## 2022-01-11 DIAGNOSIS — R1013 Epigastric pain: Secondary | ICD-10-CM

## 2022-01-11 MED ORDER — ALUMINUM-MAGNESIUM-SIMETHICONE 200-200-20 MG/5ML PO SUSP
15.0000 mL | Freq: Three times a day (TID) | ORAL | 0 refills | Status: DC
  Filled 2022-01-11: qty 1680, 28d supply, fill #0

## 2022-01-11 MED ORDER — ALUMINUM-MAGNESIUM-SIMETHICONE 200-200-20 MG/5ML PO SUSP
30.0000 mL | Freq: Three times a day (TID) | ORAL | 0 refills | Status: DC
  Filled 2022-01-11: qty 1680, 14d supply, fill #0

## 2022-01-11 MED ORDER — ALUM & MAG HYDROXIDE-SIMETH 200-200-20 MG/5ML PO SUSP
30.0000 mL | Freq: Once | ORAL | Status: AC
  Administered 2022-01-11: 30 mL via ORAL

## 2022-01-11 MED ORDER — LIDOCAINE VISCOUS HCL 2 % MT SOLN
OROMUCOSAL | Status: AC
  Filled 2022-01-11: qty 15

## 2022-01-11 MED ORDER — ALUM & MAG HYDROXIDE-SIMETH 200-200-20 MG/5ML PO SUSP
ORAL | Status: AC
  Filled 2022-01-11: qty 30

## 2022-01-11 MED ORDER — LIDOCAINE VISCOUS HCL 2 % MT SOLN
15.0000 mL | Freq: Once | OROMUCOSAL | Status: AC
  Administered 2022-01-11: 15 mL via ORAL

## 2022-01-11 MED ORDER — VARENICLINE TARTRATE 0.5 MG PO TABS
0.5000 mg | ORAL_TABLET | Freq: Two times a day (BID) | ORAL | 0 refills | Status: AC
  Filled 2022-01-11: qty 60, 30d supply, fill #0

## 2022-01-11 NOTE — Discharge Instructions (Addendum)
Today on exam your vital signs are stable, normal lung sounds are heard when listening to you, your EKG  does not show any changes from your EKG a month ago, I do not believe your heart to be the cause of the symptoms today  You may use Maalox every 6 hours, wait 30 minutes before eating, difficulty mixture medicine to reduce gas production  Your medicine to help you stop smoking has been sent to the pharmacy, if you have any further concerns regarding needing help to quit please follow-up with your primary doctor   Once home you may use your nitroglycerin tablets as prescribed to help manage her chest pain  If your chest pain worsens in severity you will need to go to the nearest emergency department for further evaluation and management

## 2022-01-11 NOTE — ED Triage Notes (Signed)
Pt reports mid abdominal pain since Wednesday. States the pain decreased on Tuesday but still has a little pain.  Also reports intermittent left sided chest pain x 1 week.  States when laying down the pain would increase.

## 2022-01-11 NOTE — ED Provider Notes (Signed)
[place]    CSN: [id number] Arrival date & time: 01/11/22  1408      History   Chief Complaint Chief Complaint  Patient presents with   Chest Pain   Abdominal Pain    HPI Bradley Haas is a 56 y.o. male.   Presents with intermittent left-sided chest pain for 7 days.  Pain is described as a squeezing and pressure sensation.  Symptoms are lessened when laying on side.  Denies shortness of breath, wheezing, dizziness, lightheadedness, visual disturbance, memory or speech changes, weakness.  Has not attempted treatment.    Patient presents with centralized abdominal pain occurring intermittently for 3 to 4 days.  Feels as if he is backed up. Taking famotidine twice daily for history of GERD.  Denies nausea, vomiting, diarrhea, increased gas production.  History of hypertension, stable angina, daily smoker.    Past Medical History:  Diagnosis Date   Facial skin lesion 10/04/2015   nose   Hypertension    Lipoma of neck 10/04/2015   left anterior   Rash 10/27/2015   legs    Patient Active Problem List   Diagnosis Date Noted   GERD (gastroesophageal reflux disease) 12/11/2021   Hypertension    Stable angina (HCC)    Abscess of face 04/03/2016   Changing skin lesion 11/02/2015   Dermoid cyst 09/27/2015   Elevated blood pressure 09/27/2015   Screening for STD (sexually transmitted disease) 09/27/2015   Tobacco abuse 09/27/2015    Past Surgical History:  Procedure Laterality Date   LESION REMOVAL N/A 11/02/2015   Procedure: NASAL LESION REMOVAL;  Surgeon: Wallace Going, DO;  Location: [facility];  Service: Plastics;  Laterality: N/A;   LIPOMA EXCISION N/A 11/02/2015   Procedure: EXCISION OF NECK LESION;  Surgeon: Wallace Going, DO;  Location: [facility];  Service: Plastics;  Laterality: N/A;   NO PAST SURGERIES         Home Medications    Prior to Admission medications   Medication Sig Start Date End Date Taking?  Authorizing Provider  amLODipine (NORVASC) 10 MG tablet Take 1 tablet (10 mg total) by mouth daily. 12/17/21   Raspet, Derry Skill, PA-C  aspirin EC 81 MG tablet Take 1 tablet (81 mg total) by mouth daily. Swallow whole. 12/11/21 03/11/22  Timothy Lasso, MD  atorvastatin (LIPITOR) 40 MG tablet Take 1 tablet (40 mg total) by mouth daily. 12/12/21 03/12/22  Timothy Lasso, MD  famotidine (PEPCID) 20 MG tablet Take 1 tablet (20 mg total) by mouth 2 (two) times daily. 12/11/21 03/11/22  Timothy Lasso, MD  losartan (COZAAR) 50 MG tablet Take 1 tablet (50 mg total) by mouth daily. 12/17/21   Raspet, Derry Skill, PA-C  nitroGLYCERIN (NITROSTAT) 0.4 MG SL tablet Place 1 tablet (0.4 mg total) under the tongue every 5 (five) minutes as needed for chest pain (Only take when you are having active chest pain). 12/11/21 03/11/22  Timothy Lasso, MD  varenicline (CHANTIX) 0.5 MG tablet Take 1 tablet (0.5 mg total) by mouth 2 (two) times daily. Patient not taking: Reported on 12/17/2021 12/11/21 03/11/22  Timothy Lasso, MD    Family History Family History  Problem Relation Age of Onset   Prostate cancer Father     Social History Social History   Tobacco Use   Smoking status: Every Day    Packs/day: 1.00    Years: 20.00    Total pack years: 20.00    Types: Cigarettes   Smokeless  tobacco: Never  Vaping Use   Vaping Use: Never used  Substance Use Topics   Alcohol use: Yes    Comment: socially   Drug use: Not Currently    Frequency: 7.0 times per week    Types: Marijuana    Comment: last used 10/26/2015 - advised to stop 3 days prior to surgery     Allergies   Patient has no known allergies.   Review of Systems Review of Systems  Constitutional: Negative.   HENT: Negative.    Eyes: Negative.   Respiratory: Negative.    Cardiovascular:  Positive for chest pain. Negative for palpitations and leg swelling.  Gastrointestinal:  Positive for abdominal pain. Negative for abdominal distention, anal bleeding, blood  in stool, constipation, diarrhea, nausea, rectal pain and vomiting.  Skin: Negative.   Neurological: Negative.      Physical Exam Triage Vital Signs ED Triage Vitals  Enc Vitals Group     BP 01/11/22 1548 118/68     Pulse Rate 01/11/22 1548 (!) 50     Resp 01/11/22 1548 19     Temp 01/11/22 1548 98.1 F (36.7 C)     Temp Source 01/11/22 1548 Oral     SpO2 01/11/22 1548 100 %     Weight --      Height --      Head Circumference --      Peak Flow --      Pain Score 01/11/22 1546 4     Pain Loc --      Pain Edu? --      Excl. in [place]? --    No data found.  Updated Vital Signs BP 118/68 (BP Location: Left Arm)   Pulse (!) 50   Temp 98.1 F (36.7 C) (Oral)   Resp 19   SpO2 100%   Visual Acuity Right Eye Distance:   Left Eye Distance:   Bilateral Distance:    Right Eye Near:   Left Eye Near:    Bilateral Near:     Physical Exam Constitutional:      Appearance: Normal appearance. He is well-developed.  HENT:     Head: Normocephalic.  Eyes:     Extraocular Movements: Extraocular movements intact.  Cardiovascular:     Rate and Rhythm: Normal rate and regular rhythm.     Pulses: Normal pulses.     Heart sounds: Normal heart sounds.  Pulmonary:     Effort: Pulmonary effort is normal.     Breath sounds: Normal breath sounds.  Abdominal:     General: Abdomen is flat. Bowel sounds are increased.     Palpations: Abdomen is soft.     Tenderness: There is abdominal tenderness in the epigastric area.  Skin:    General: Skin is warm and dry.  Neurological:     Mental Status: He is alert and oriented to person, place, and time. Mental status is at baseline.  Psychiatric:        Mood and Affect: Mood normal.        Behavior: Behavior normal.      UC Treatments / Results  Labs (all labs ordered are listed, but only abnormal results are displayed) Labs Reviewed - No data to display  EKG   Radiology No results found.  Procedures Procedures (including  critical care time)  Medications Ordered in UC Medications  alum & mag hydroxide-simeth (MAALOX/MYLANTA) 767-209-47 MG/5ML suspension 30 mL (has no administration in time range)    And  lidocaine (  XYLOCAINE) 2 % viscous mouth solution 15 mL (has no administration in time range)    Initial Impression / Assessment and Plan / UC Course  I have reviewed the triage vital signs and the nursing notes.  Pertinent labs & imaging results that were available during my care of the patient were reviewed by me and considered in my medical decision making (see chart for details).  Epigastric abdominal pain Stable angina pectoris  Vital signs are stable, EKG showing no changes in comparison to recent EKG within the last 1 to 2 months, S1 and S2 heard on examination no suspicion for cardiac involvement this time, abdomen is distended with decreased bowel sounds, epigastric region discussed findings with patient, Maalox with lidocaine given in office, reevaluate symptoms, recommended that patient use nitroglycerin which is prescribed by his primary doctor for management of chest pain, if symptoms continue to persist after 3 doses patient may follow-up at nearest emergency department for evaluation and management, work note given Final Clinical Impressions(s) / UC Diagnoses   Final diagnoses:  None   Discharge Instructions   None    ED Prescriptions   None    PDMP not reviewed this encounter.   Hans Eden, NP 01/11/22 1734

## 2022-01-14 ENCOUNTER — Other Ambulatory Visit (HOSPITAL_COMMUNITY): Payer: Self-pay

## 2022-01-15 ENCOUNTER — Ambulatory Visit: Payer: BLUE CROSS/BLUE SHIELD | Admitting: Cardiology

## 2022-01-18 ENCOUNTER — Ambulatory Visit: Payer: BLUE CROSS/BLUE SHIELD | Admitting: Cardiology

## 2022-01-22 ENCOUNTER — Other Ambulatory Visit (HOSPITAL_COMMUNITY): Payer: Self-pay

## 2023-10-14 ENCOUNTER — Other Ambulatory Visit (HOSPITAL_COMMUNITY): Payer: Self-pay

## 2023-10-14 ENCOUNTER — Encounter (HOSPITAL_COMMUNITY): Payer: Self-pay | Admitting: Emergency Medicine

## 2023-10-14 ENCOUNTER — Ambulatory Visit (HOSPITAL_COMMUNITY)
Admission: EM | Admit: 2023-10-14 | Discharge: 2023-10-14 | Disposition: A | Discharge status: Home / Self Care | Payer: Medicaid Other | Attending: Emergency Medicine | Admitting: Emergency Medicine

## 2023-10-14 DIAGNOSIS — R1013 Epigastric pain: Secondary | ICD-10-CM

## 2023-10-14 DIAGNOSIS — J069 Acute upper respiratory infection, unspecified: Secondary | ICD-10-CM

## 2023-10-14 DIAGNOSIS — R051 Acute cough: Secondary | ICD-10-CM | POA: Diagnosis not present

## 2023-10-14 MED ORDER — LIDOCAINE VISCOUS HCL 2 % MT SOLN
15.0000 mL | Freq: Once | OROMUCOSAL | Status: AC
  Administered 2023-10-14: 15 mL via OROMUCOSAL

## 2023-10-14 MED ORDER — ALUM & MAG HYDROXIDE-SIMETH 200-200-20 MG/5ML PO SUSP
ORAL | Status: AC
  Filled 2023-10-14: qty 30

## 2023-10-14 MED ORDER — PROMETHAZINE-DM 6.25-15 MG/5ML PO SYRP
5.0000 mL | ORAL_SOLUTION | Freq: Every evening | ORAL | 0 refills | Status: AC | PRN
  Filled 2023-10-14: qty 118, 23d supply, fill #0

## 2023-10-14 MED ORDER — AZITHROMYCIN 250 MG PO TABS
ORAL_TABLET | ORAL | 0 refills | Status: AC
  Filled 2023-10-14: qty 6, 5d supply, fill #0

## 2023-10-14 MED ORDER — LIDOCAINE VISCOUS HCL 2 % MT SOLN
OROMUCOSAL | Status: AC
Start: 2023-10-14 — End: ?
  Filled 2023-10-14: qty 15

## 2023-10-14 MED ORDER — PANTOPRAZOLE SODIUM 20 MG PO TBEC
20.0000 mg | DELAYED_RELEASE_TABLET | Freq: Every day | ORAL | 0 refills | Status: AC
  Filled 2023-10-14: qty 30, 30d supply, fill #0

## 2023-10-14 MED ORDER — BENZONATATE 100 MG PO CAPS
100.0000 mg | ORAL_CAPSULE | Freq: Three times a day (TID) | ORAL | 0 refills | Status: AC
  Filled 2023-10-14: qty 21, 7d supply, fill #0

## 2023-10-14 MED ORDER — ALUM & MAG HYDROXIDE-SIMETH 200-200-20 MG/5ML PO SUSP
30.0000 mL | Freq: Once | ORAL | Status: AC
  Administered 2023-10-14: 30 mL via ORAL

## 2023-10-14 NOTE — Discharge Instructions (Signed)
 Start taking azithromycin by taking 2 tablets today and 1 tablet on the remaining 4 days.  Prescribed Tessalon that you can take every 8 hours as needed for cough.  I have also prescribed promethazine DM cough syrup that you can take at night for cough.  This can make you drowsy so do not take while driving.    Otherwise alternate between Tylenol and ibuprofen as needed for pain.  I also recommend taking Mucinex to help with cough and congestion.  Stay hydrated and get some rest.  I have also prescribed pantoprazole that you can take once daily to help with upper abdominal discomfort and indigestion.  Return here if symptoms persist.

## 2023-10-14 NOTE — ED Triage Notes (Signed)
 Pt c/o epigastric pain that has been intermittent for a week. Hasn't taken anything for symptoms.  Reports productive cough for a month. When first started took Nyquil but none since. Did few cough drops.

## 2023-10-14 NOTE — ED Provider Notes (Signed)
 [facility]    CSN: [id number] Arrival date & time: 10/14/23  1729      History   Chief Complaint Chief Complaint  Patient presents with   Abdominal Pain   Cough    HPI Bradley Haas is a 58 y.o. male.   Patient presents with productive cough and congestion x 1 month.  Patient states that symptoms initially began with fever, body aches, and chills along with productive cough and congestion.  Denies chest pain, back pain, shortness of breath, and fever at this time.  Patient also presents with intermittent epigastric pain x 1 week.  Patient denies taking thing for symptoms.  Denies nausea, vomiting, diarrhea, and severe abdominal pain.   Abdominal Pain Associated symptoms: cough   Cough   Past Medical History:  Diagnosis Date   Facial skin lesion 10/04/2015   nose   Hypertension    Lipoma of neck 10/04/2015   left anterior   Rash 10/27/2015   legs    Patient Active Problem List   Diagnosis Date Noted   GERD (gastroesophageal reflux disease) 12/11/2021   Hypertension    Stable angina (HCC)    Abscess of face 04/03/2016   Changing skin lesion 11/02/2015   Dermoid cyst 09/27/2015   Elevated blood pressure 09/27/2015   Screening for STD (sexually transmitted disease) 09/27/2015   Tobacco abuse 09/27/2015    Past Surgical History:  Procedure Laterality Date   LESION REMOVAL N/A 11/02/2015   Procedure: NASAL LESION REMOVAL;  Surgeon: Peggye Form, DO;  Location: [facility];  Service: Plastics;  Laterality: N/A;   LIPOMA EXCISION N/A 11/02/2015   Procedure: EXCISION OF NECK LESION;  Surgeon: Peggye Form, DO;  Location: [facility];  Service: Plastics;  Laterality: N/A;   NO PAST SURGERIES         Home Medications    Prior to Admission medications   Medication Sig Start Date End Date Taking? Authorizing Provider  azithromycin (ZITHROMAX Z-PAK) 250 MG tablet Take 2 pills (500mg ) first day and one pill  (250mg ) the remaining 4 days. 10/14/23 10/19/23 Yes Susann Givens, Galina Haddox A, NP  benzonatate (TESSALON) 100 MG capsule Take 1 capsule (100 mg total) by mouth every 8 (eight) hours. 10/14/23  Yes Susann Givens, Deloma Spindle A, NP  pantoprazole (PROTONIX) 20 MG tablet Take 1 tablet (20 mg total) by mouth daily. 10/14/23  Yes Wynonia Lawman A, NP  promethazine-dextromethorphan (PROMETHAZINE-DM) 6.25-15 MG/5ML syrup Take 5 mLs by mouth at bedtime as needed for cough. 10/14/23  Yes Susann Givens, Henli Hey A, NP  atorvastatin (LIPITOR) 40 MG tablet Take 1 tablet (40 mg total) by mouth daily. 12/12/21 03/12/22  Dellis Filbert, MD  famotidine (PEPCID) 20 MG tablet Take 1 tablet (20 mg total) by mouth 2 (two) times daily. 12/11/21 03/11/22  Dellis Filbert, MD  nitroGLYCERIN (NITROSTAT) 0.4 MG SL tablet Place 1 tablet (0.4 mg total) under the tongue every 5 (five) minutes as needed for chest pain (Only take when you are having active chest pain). 12/11/21 03/11/22  Dellis Filbert, MD    Family History Family History  Problem Relation Age of Onset   Prostate cancer Father     Social History Social History   Tobacco Use   Smoking status: Every Day    Current packs/day: 1.00    Average packs/day: 1 pack/day for 20.0 years (20.0 ttl pk-yrs)    Types: Cigarettes   Smokeless tobacco: Never  Vaping Use   Vaping status: Never Used  Substance  Use Topics   Alcohol use: Yes    Comment: socially   Drug use: Not Currently    Frequency: 7.0 times per week    Types: Marijuana    Comment: last used 10/26/2015 - advised to stop 3 days prior to surgery     Allergies   Patient has no known allergies.   Review of Systems Review of Systems  Respiratory:  Positive for cough.   Gastrointestinal:  Positive for abdominal pain.     Physical Exam Triage Vital Signs ED Triage Vitals  Encounter Vitals Group     BP 10/14/23 1910 (!) 166/80     Systolic BP Percentile --      Diastolic BP Percentile --      Pulse Rate 10/14/23 1910 (!)  51     Resp 10/14/23 1910 18     Temp 10/14/23 1910 98.4 F (36.9 C)     Temp Source 10/14/23 1910 Oral     SpO2 10/14/23 1910 97 %     Weight --      Height --      Head Circumference --      Peak Flow --      Pain Score 10/14/23 1909 5     Pain Loc --      Pain Education --      Exclude from Growth Chart --    No data found.  Updated Vital Signs BP (!) 166/80 (BP Location: Left Arm)   Pulse (!) 51   Temp 98.4 F (36.9 C) (Oral)   Resp 18   SpO2 97%   Visual Acuity Right Eye Distance:   Left Eye Distance:   Bilateral Distance:    Right Eye Near:   Left Eye Near:    Bilateral Near:     Physical Exam Vitals and nursing note reviewed.  Constitutional:      General: He is awake. He is not in acute distress.    Appearance: Normal appearance. He is well-developed and well-groomed. He is not ill-appearing.  HENT:     Right Ear: Tympanic membrane, ear canal and external ear normal.     Left Ear: Tympanic membrane, ear canal and external ear normal.     Nose: Congestion and rhinorrhea present.     Mouth/Throat:     Mouth: Mucous membranes are moist.     Pharynx: Posterior oropharyngeal erythema present. No oropharyngeal exudate.  Cardiovascular:     Rate and Rhythm: Normal rate and regular rhythm.  Pulmonary:     Effort: Pulmonary effort is normal.     Breath sounds: Normal breath sounds.  Abdominal:     General: Abdomen is flat. Bowel sounds are normal.     Palpations: Abdomen is soft.     Tenderness: There is abdominal tenderness in the epigastric area. There is no guarding or rebound.  Musculoskeletal:        General: Normal range of motion.  Skin:    General: Skin is warm and dry.  Neurological:     Mental Status: He is alert.  Psychiatric:        Behavior: Behavior is cooperative.      UC Treatments / Results  Labs (all labs ordered are listed, but only abnormal results are displayed) Labs Reviewed - No data to display  EKG   Radiology No  results found.  Procedures Procedures (including critical care time)  Medications Ordered in UC Medications  alum & mag hydroxide-simeth (MAALOX/MYLANTA) 200-200-20 MG/5ML suspension 30 mL (  30 mLs Oral Given 10/14/23 1953)  lidocaine (XYLOCAINE) 2 % viscous mouth solution 15 mL (15 mLs Mouth/Throat Given 10/14/23 1953)    Initial Impression / Assessment and Plan / UC Course  I have reviewed the triage vital signs and the nursing notes.  Pertinent labs & imaging results that were available during my care of the patient were reviewed by me and considered in my medical decision making (see chart for details).     Upon assessment congestion and rhinorrhea present, mild erythema noted to pharynx.  Lungs clear bilaterally to auscultation.  Tender upon palpation to epigastric region.  GI cocktail given with relief of epigastric pain.  Prescribed azithromycin for persistent upper respiratory infection.  Prescribed Tessalon and Promethazine DM as needed for cough.  Prescribed pantoprazole for epigastric pain and indigestion.  Discussed return precautions. Final Clinical Impressions(s) / UC Diagnoses   Final diagnoses:  Acute upper respiratory infection  Acute cough  Epigastric pain     Discharge Instructions      Start taking azithromycin by taking 2 tablets today and 1 tablet on the remaining 4 days.  Prescribed Tessalon that you can take every 8 hours as needed for cough.  I have also prescribed promethazine DM cough syrup that you can take at night for cough.  This can make you drowsy so do not take while driving.    Otherwise alternate between Tylenol and ibuprofen as needed for pain.  I also recommend taking Mucinex to help with cough and congestion.  Stay hydrated and get some rest.  I have also prescribed pantoprazole that you can take once daily to help with upper abdominal discomfort and indigestion.  Return here if symptoms persist.      ED Prescriptions     Medication  Sig Dispense Auth. Provider   pantoprazole (PROTONIX) 20 MG tablet Take 1 tablet (20 mg total) by mouth daily. 30 tablet Susann Givens, Briggitte Boline A, NP   azithromycin (ZITHROMAX Z-PAK) 250 MG tablet Take 2 pills (500mg ) first day and one pill (250mg ) the remaining 4 days. 6 tablet Wynonia Lawman A, NP   benzonatate (TESSALON) 100 MG capsule Take 1 capsule (100 mg total) by mouth every 8 (eight) hours. 21 capsule Wynonia Lawman A, NP   promethazine-dextromethorphan (PROMETHAZINE-DM) 6.25-15 MG/5ML syrup Take 5 mLs by mouth at bedtime as needed for cough. 118 mL Wynonia Lawman A, NP      PDMP not reviewed this encounter.   Wynonia Lawman A, NP 10/14/23 2034

## 2023-10-15 ENCOUNTER — Other Ambulatory Visit (HOSPITAL_COMMUNITY): Payer: Self-pay

## 2023-11-26 ENCOUNTER — Encounter (HOSPITAL_COMMUNITY): Payer: Self-pay

## 2023-11-26 ENCOUNTER — Ambulatory Visit (HOSPITAL_COMMUNITY): Admission: EM | Admit: 2023-11-26 | Discharge: 2023-11-26 | Disposition: A | Discharge status: Home / Self Care

## 2023-11-26 DIAGNOSIS — R110 Nausea: Secondary | ICD-10-CM | POA: Diagnosis not present

## 2023-11-26 DIAGNOSIS — K2900 Acute gastritis without bleeding: Secondary | ICD-10-CM

## 2023-11-26 NOTE — ED Provider Notes (Signed)
 [facility]    CSN: [id number] Arrival date & time: 11/26/23  1721      History   Chief Complaint Chief Complaint  Patient presents with   Nausea    Nausea x1 week    HPI Bradley Haas is a 58 y.o. male.   Patient is a 58 year old male that presents today with nausea.  The nausea has been pretty constant for the last week and waxing and waning.  The nausea has improved and is feeling better today he has been eating.  He has not had any associated vomiting, diarrhea, fever, chills.  He denies any blood in stool. History of GERD     Past Medical History:  Diagnosis Date   Facial skin lesion 10/04/2015   nose   Hypertension    Lipoma of neck 10/04/2015   left anterior   Rash 10/27/2015   legs    Patient Active Problem List   Diagnosis Date Noted   GERD (gastroesophageal reflux disease) 12/11/2021   Hypertension    Stable angina (HCC)    Abscess of face 04/03/2016   Changing skin lesion 11/02/2015   Dermoid cyst 09/27/2015   Elevated blood pressure 09/27/2015   Screening for STD (sexually transmitted disease) 09/27/2015   Tobacco abuse 09/27/2015    Past Surgical History:  Procedure Laterality Date   LESION REMOVAL N/A 11/02/2015   Procedure: NASAL LESION REMOVAL;  Surgeon: Thornell Flirt, DO;  Location: [facility];  Service: Plastics;  Laterality: N/A;   LIPOMA EXCISION N/A 11/02/2015   Procedure: EXCISION OF NECK LESION;  Surgeon: Thornell Flirt, DO;  Location: [facility];  Service: Plastics;  Laterality: N/A;   NO PAST SURGERIES         Home Medications    Prior to Admission medications   Medication Sig Start Date End Date Taking? Authorizing Provider  atorvastatin  (LIPITOR) 40 MG tablet Take 1 tablet (40 mg total) by mouth daily. 12/12/21 03/12/22  Ariwodo, Shirley, MD  benzonatate  (TESSALON ) 100 MG capsule Take 1 capsule (100 mg total) by mouth every 8 (eight) hours. 10/14/23   Levora Reas A, NP   famotidine  (PEPCID ) 20 MG tablet Take 1 tablet (20 mg total) by mouth 2 (two) times daily. 12/11/21 03/11/22  Tucker Gails, MD  nitroGLYCERIN  (NITROSTAT ) 0.4 MG SL tablet Place 1 tablet (0.4 mg total) under the tongue every 5 (five) minutes as needed for chest pain (Only take when you are having active chest pain). 12/11/21 03/11/22  Ariwodo, Shirley, MD  pantoprazole  (PROTONIX ) 20 MG tablet Take 1 tablet (20 mg total) by mouth daily. 10/14/23   Levora Reas A, NP  promethazine -dextromethorphan (PROMETHAZINE -DM) 6.25-15 MG/5ML syrup Take 5 mLs by mouth at bedtime as needed for cough. 10/14/23   Karon Packer, NP    Family History Family History  Problem Relation Age of Onset   Prostate cancer Father     Social History Social History   Tobacco Use   Smoking status: Every Day    Current packs/day: 1.00    Average packs/day: 1 pack/day for 20.0 years (20.0 ttl pk-yrs)    Types: Cigarettes   Smokeless tobacco: Never  Vaping Use   Vaping status: Never Used  Substance Use Topics   Alcohol use: Yes    Comment: socially   Drug use: Not Currently    Frequency: 7.0 times per week    Types: Marijuana    Comment: last used 10/26/2015 - advised to stop 3 days  prior to surgery     Allergies   Patient has no known allergies.   Review of Systems Review of Systems  See HPI Physical Exam Triage Vital Signs ED Triage Vitals  Encounter Vitals Group     BP 11/26/23 1812 (!) 152/93     Systolic BP Percentile --      Diastolic BP Percentile --      Pulse Rate 11/26/23 1812 (!) 56     Resp 11/26/23 1812 17     Temp 11/26/23 1812 98.1 F (36.7 C)     Temp Source 11/26/23 1812 Oral     SpO2 11/26/23 1812 97 %     Weight 11/26/23 1811 150 lb (68 kg)     Height 11/26/23 1811 5\' 9"  (1.753 m)     Head Circumference --      Peak Flow --      Pain Score 11/26/23 1811 0     Pain Loc --      Pain Education --      Exclude from Growth Chart --    No data found.  Updated Vital  Signs BP (!) 152/93 (BP Location: Right Arm)   Pulse (!) 56   Temp 98.1 F (36.7 C) (Oral)   Resp 17   Ht 5\' 9"  (1.753 m)   Wt 150 lb (68 kg)   SpO2 97%   BMI 22.15 kg/m   Visual Acuity Right Eye Distance:   Left Eye Distance:   Bilateral Distance:    Right Eye Near:   Left Eye Near:    Bilateral Near:     Physical Exam Vitals and nursing note reviewed.  Constitutional:      General: He is not in acute distress.    Appearance: Normal appearance. He is not ill-appearing, toxic-appearing or diaphoretic.  Abdominal:     General: There is no distension.     Palpations: Abdomen is soft.     Tenderness: There is abdominal tenderness.  Skin:    General: Skin is warm and dry.  Neurological:     Mental Status: He is alert.      UC Treatments / Results  Labs (all labs ordered are listed, but only abnormal results are displayed) Labs Reviewed - No data to display  EKG   Radiology No results found.  Procedures Procedures (including critical care time)  Medications Ordered in UC Medications - No data to display  Initial Impression / Assessment and Plan / UC Course  I have reviewed the triage vital signs and the nursing notes.  Pertinent labs & imaging results that were available during my care of the patient were reviewed by me and considered in my medical decision making (see chart for details).     Acute Gastritis- no concerns on exam. Seems like the symptoms are resolving. Hx of GERD. No taking any PPI. Recommend taking Pepcid  when he gets these flares ups. Spoke about foods to avoid.  Gave contact for PCP.   Final Clinical Impressions(s) / UC Diagnoses   Final diagnoses:  Nausea  Acute gastritis without hemorrhage, unspecified gastritis type     Discharge Instructions      I believe that your symptoms are related to indigestion. If this happens again you can take some Pepcid  over-the-counter to help with your symptoms.  Recommend avoiding spicy,  greasy, fried foods because this can irritate the stomach.  Alcohol and chocolate can also irritate the stomach. I have put a contact on here for PCP  ED Prescriptions   None    PDMP not reviewed this encounter.   Landa Pine, FNP 11/26/23 1907

## 2023-11-26 NOTE — ED Triage Notes (Signed)
 Pt states that he has some nausea. X1 week Pt denies any abdominal pain, vomiting or diarrhea.

## 2023-11-26 NOTE — Discharge Instructions (Signed)
 I believe that your symptoms are related to indigestion. If this happens again you can take some Pepcid  over-the-counter to help with your symptoms.  Recommend avoiding spicy, greasy, fried foods because this can irritate the stomach.  Alcohol and chocolate can also irritate the stomach. I have put a contact on here for PCP

## 2023-12-15 ENCOUNTER — Other Ambulatory Visit (HOSPITAL_COMMUNITY): Payer: Self-pay
